# Patient Record
Sex: Male | Born: 1960 | Race: White | Hispanic: No | Marital: Married | Smoking: Former smoker
Health system: Southern US, Community
[De-identification: ages and names within clinical notes are randomized; demographics above are authoritative.]

## PROBLEM LIST (undated history)

## (undated) DIAGNOSIS — F419 Anxiety disorder, unspecified: Secondary | ICD-10-CM

## (undated) DIAGNOSIS — I10 Essential (primary) hypertension: Secondary | ICD-10-CM

## (undated) DIAGNOSIS — E785 Hyperlipidemia, unspecified: Secondary | ICD-10-CM

## (undated) HISTORY — DX: Hyperlipidemia, unspecified: E78.5

---

## 2010-11-09 ENCOUNTER — Ambulatory Visit: Payer: Self-pay | Admitting: Internal Medicine

## 2011-05-17 ENCOUNTER — Ambulatory Visit: Payer: Self-pay

## 2011-05-17 LAB — DOT URINE DIP
Glucose,UR: NEGATIVE mg/dL (ref 0–75)
Protein: NEGATIVE
Specific Gravity: 1.01 (ref 1.003–1.030)

## 2012-04-18 ENCOUNTER — Ambulatory Visit: Payer: Self-pay | Admitting: Family Medicine

## 2012-04-18 LAB — DOT URINE DIP
Blood: NEGATIVE
Protein: NEGATIVE

## 2012-10-13 IMAGING — CR DG CHEST 2V
1 series · 2 of 2 positions shown · non-contrast
Comparison: none

REASON FOR EXAM: cough
COMMENTS:

PROCEDURE:     DXR - DXR CHEST PA (OR AP) AND LATERAL  - November 09, 2010 [DATE]
RESULT:     The lung fields are clear. No pneumonia, pneumothorax or pleural
effusion is seen. Heart size is normal.

[Series 1: w chest pa · 0.14mm/px · 2 of 2 slices shown]
[im 1/2]
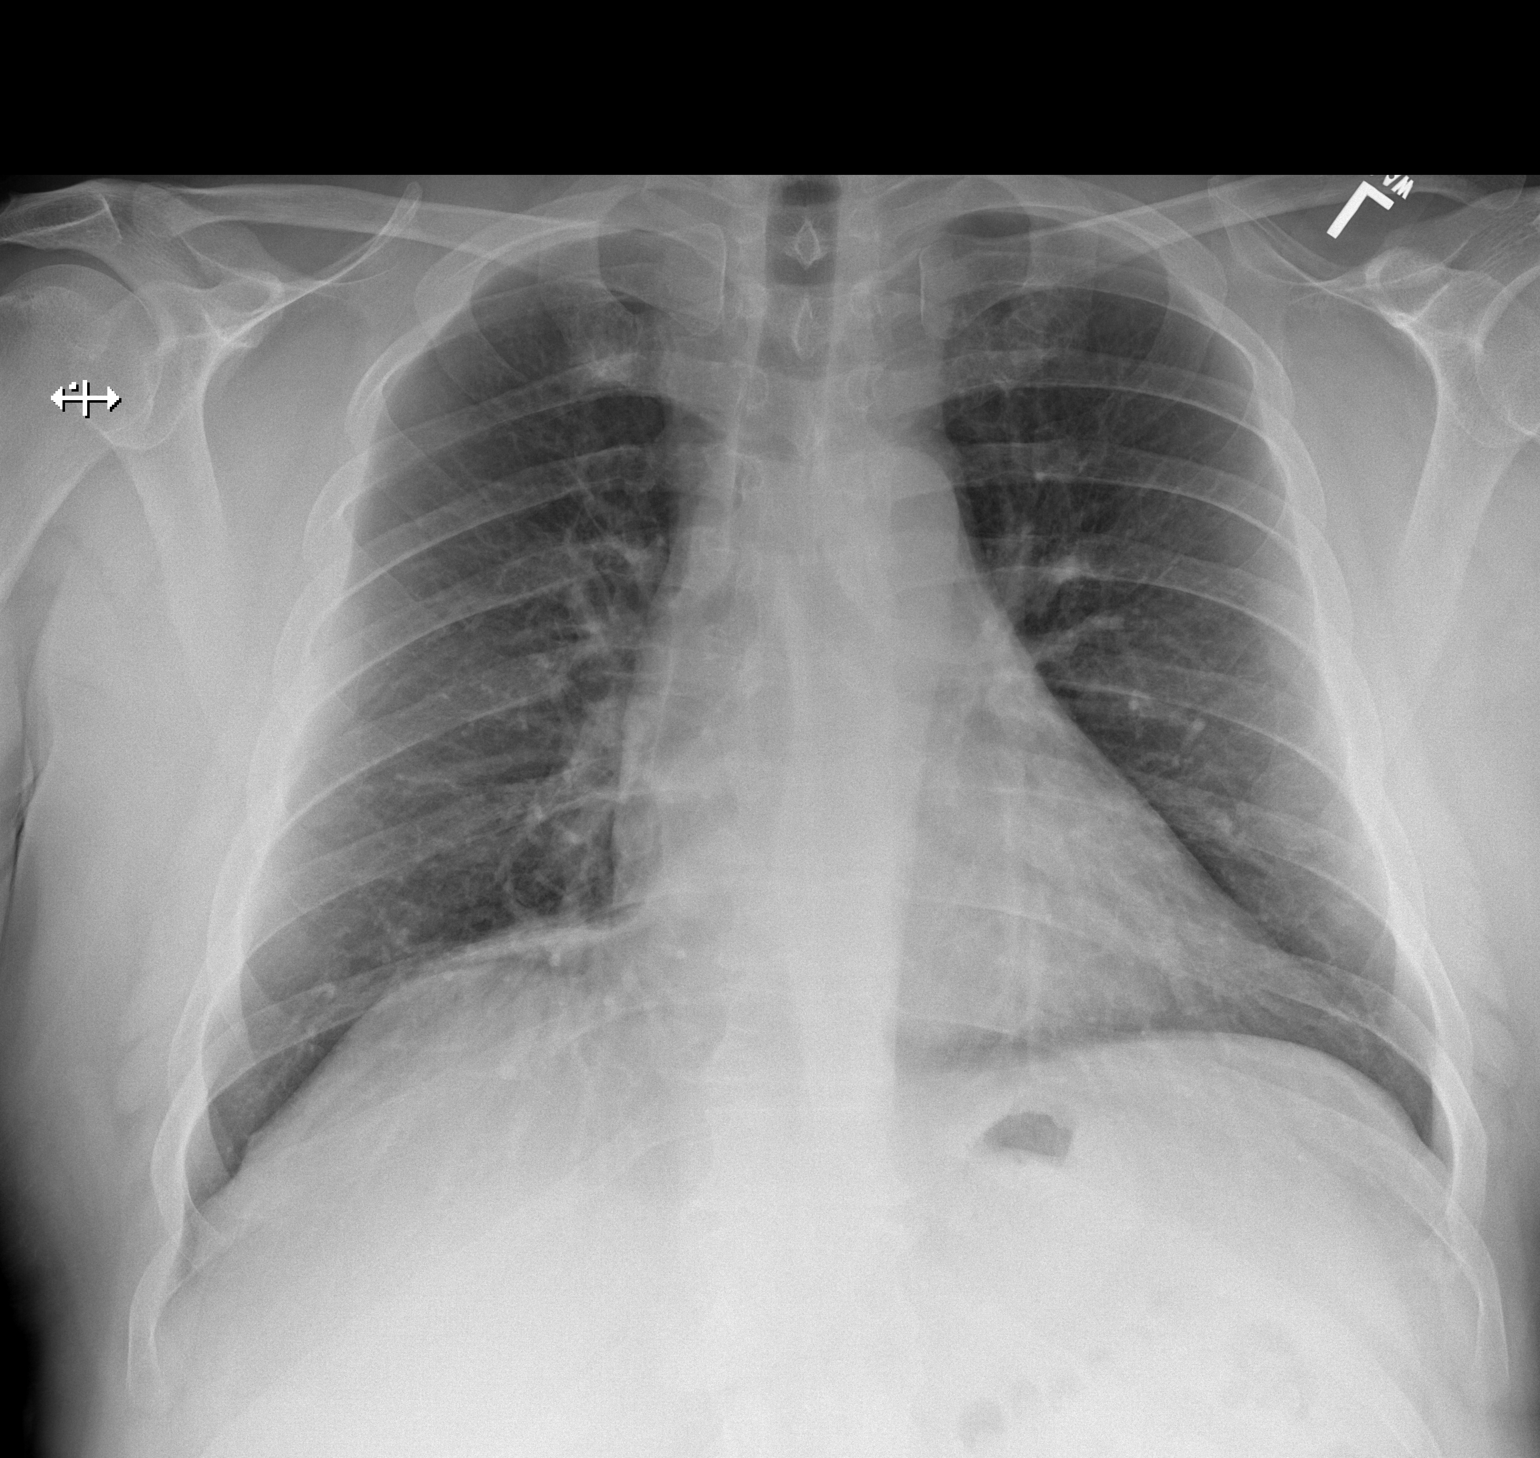
[im 2/2]
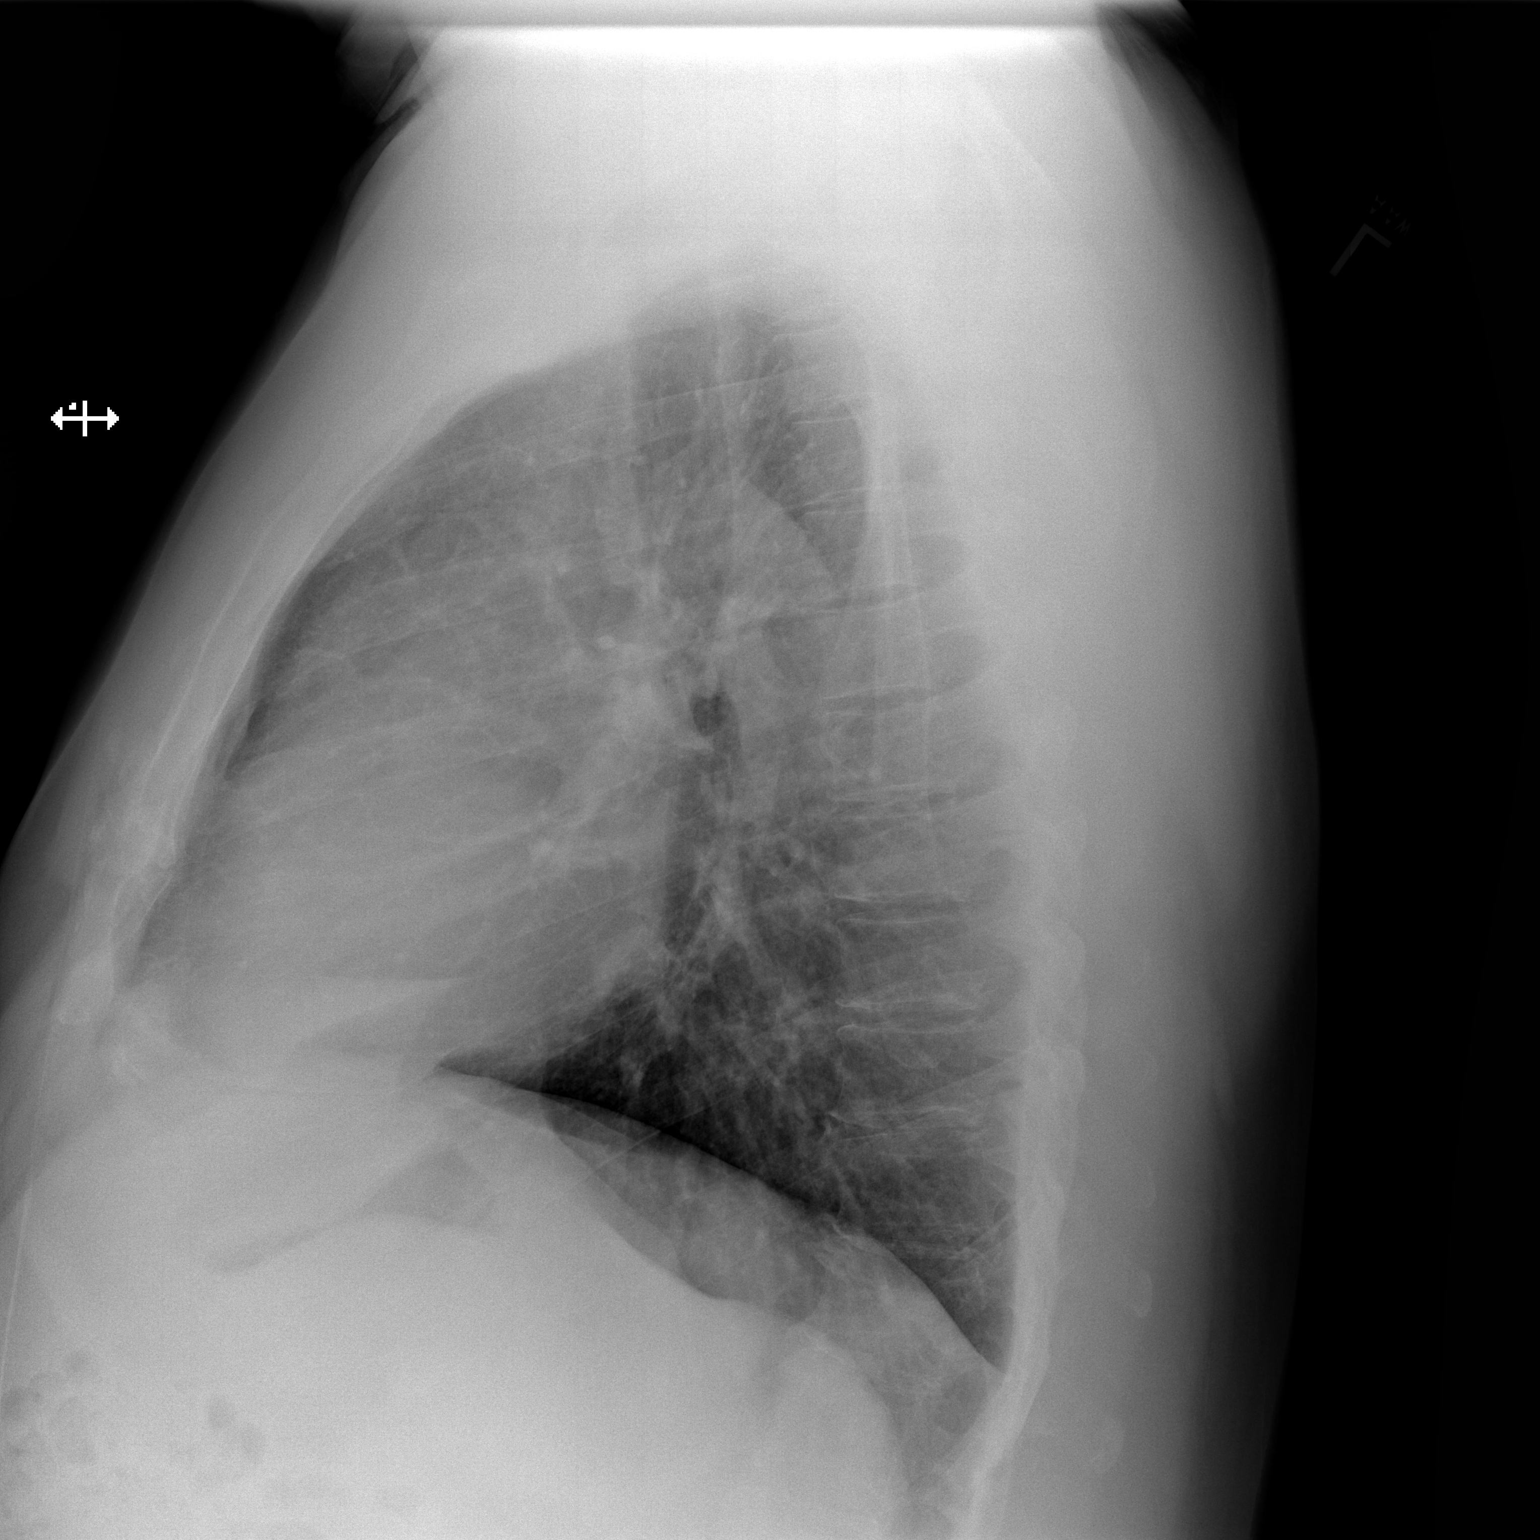

[2 of 2 positions shown; findings below may reference images not displayed]

IMPRESSION: No acute changes are identified.

## 2013-04-10 ENCOUNTER — Ambulatory Visit: Payer: Self-pay | Admitting: Family Medicine

## 2013-04-10 LAB — DOT URINE DIP
BLOOD: NEGATIVE
Glucose,UR: NEGATIVE mg/dL (ref 0–75)
Protein: NEGATIVE
SPECIFIC GRAVITY: 1.01 (ref 1.003–1.030)

## 2013-05-14 ENCOUNTER — Ambulatory Visit: Payer: Self-pay | Admitting: Internal Medicine

## 2013-07-10 ENCOUNTER — Ambulatory Visit: Payer: Self-pay | Admitting: Internal Medicine

## 2014-04-02 ENCOUNTER — Ambulatory Visit: Payer: Self-pay | Admitting: Family Medicine

## 2015-03-23 ENCOUNTER — Ambulatory Visit
Admission: EM | Admit: 2015-03-23 | Discharge: 2015-03-23 | Disposition: A | Discharge status: Home / Self Care | Payer: Self-pay | Attending: Family Medicine | Admitting: Family Medicine

## 2015-03-23 ENCOUNTER — Encounter: Payer: Self-pay | Admitting: *Deleted

## 2015-03-23 DIAGNOSIS — Z021 Encounter for pre-employment examination: Secondary | ICD-10-CM

## 2015-03-23 DIAGNOSIS — Z024 Encounter for examination for driving license: Secondary | ICD-10-CM

## 2015-03-23 HISTORY — DX: Essential (primary) hypertension: I10

## 2015-03-23 HISTORY — DX: Anxiety disorder, unspecified: F41.9

## 2015-03-23 LAB — DEPT OF TRANSP DIPSTICK, URINE (ARMC ONLY)
GLUCOSE, UA: NEGATIVE mg/dL
HGB URINE DIPSTICK: NEGATIVE
Protein, ur: NEGATIVE mg/dL
Specific Gravity, Urine: 1.02 (ref 1.005–1.030)

## 2015-03-23 NOTE — ED Provider Notes (Signed)
CSN: [id number]     Arrival date & time 03/23/15  0857 History   First MD Initiated Contact with Patient 03/23/15 1057    Nurses notes were reviewed. 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 Hypertension   . Anxiety    History reviewed. No pertinent past surgical history. History reviewed. No pertinent family history. Social History  Substance Use Topics  . Smoking status: Former Games developer  . Smokeless tobacco: Never Used  . Alcohol Use: No    Review of Systems  All other systems reviewed and are negative.   Allergies  Review of patient's allergies indicates no known allergies.  Home Medications   Prior to Admission medications   Medication Sig Start Date End Date Taking? Authorizing Provider  amLODipine (NORVASC) 5 MG tablet Take 5 mg by mouth daily.   Yes Historical Provider, MD  lisinopril (PRINIVIL,ZESTRIL) 40 MG tablet Take 40 mg by mouth daily.   Yes Historical Provider, MD  PARoxetine (PAXIL) 20 MG tablet Take 20 mg by mouth daily.   Yes Historical Provider, MD   Meds Ordered and Administered this Visit  Medications - No data to display  BP 140/88 mmHg  Pulse 64  Temp(Src) 97.9 F (36.6 C) (Oral)  Resp 18  Ht  (1.854 m)  Wt 297 lb (134.718 kg)  BMI 39.19 kg/m2  SpO2 100% No data found.   Physical Exam  Constitutional: He is oriented to person, place, and time. He appears well-developed and well-nourished.  Obese white male  HENT:  Head: Normocephalic and atraumatic.  Eyes: Pupils are equal, round, and reactive to light.  Neck: Normal range of motion.  Cardiovascular: Normal rate and regular rhythm.   Pulmonary/Chest: Effort normal and breath sounds normal.  Abdominal: Soft. Hernia confirmed negative in the right inguinal area and confirmed negative in  the left inguinal area.  Protuberant abdomen limited  Genitourinary: Testes normal and penis normal.  Musculoskeletal: Normal range of motion.  Neurological: He is alert and oriented to person, place, and time.  Skin: Skin is warm and dry. No erythema.  Vitals reviewed.   ED Course  Procedures (including critical care time)  Labs Review Labs Reviewed  DEPT OF TRANSP DIPSTICK, URINE([facility] ONLY)    Imaging Review No results found.   Visual Acuity Review  Right Eye Distance:   Left Eye Distance:   Bilateral Distance:    Right Eye Near:   Left Eye Near:    Bilateral Near:         MDM   1. Encounter for commercial driver medical examination (CDME)     Patient be given a year DOT due to hypertension and sleep apnea. He did bring his form and showing 100% compliance over 4 hours the last month with sleep apnea machine.  Note: This dictation was prepared with Dragon dictation along with smaller phrase technology. Any transcriptional errors that result from this process are unintentional.    Hassan Rowan, MD 03/23/15 1130

## 2015-03-23 NOTE — Discharge Instructions (Signed)

## 2015-03-23 NOTE — ED Notes (Signed)
Patient is here for DOT/CDL physical.

## 2016-02-03 ENCOUNTER — Ambulatory Visit: Payer: Self-pay | Admitting: Primary Care

## 2016-02-17 ENCOUNTER — Ambulatory Visit (INDEPENDENT_AMBULATORY_CARE_PROVIDER_SITE_OTHER): Payer: BLUE CROSS/BLUE SHIELD | Admitting: Primary Care

## 2016-02-17 ENCOUNTER — Encounter: Payer: Self-pay | Admitting: Primary Care

## 2016-02-17 VITALS — BP 136/84 | HR 66 | Temp 97.8°F | Ht 73.0 in | Wt 304.8 lb

## 2016-02-17 DIAGNOSIS — E785 Hyperlipidemia, unspecified: Secondary | ICD-10-CM

## 2016-02-17 DIAGNOSIS — Z1159 Encounter for screening for other viral diseases: Secondary | ICD-10-CM | POA: Diagnosis not present

## 2016-02-17 DIAGNOSIS — F411 Generalized anxiety disorder: Secondary | ICD-10-CM

## 2016-02-17 DIAGNOSIS — Z0000 Encounter for general adult medical examination without abnormal findings: Secondary | ICD-10-CM | POA: Diagnosis not present

## 2016-02-17 DIAGNOSIS — I10 Essential (primary) hypertension: Secondary | ICD-10-CM

## 2016-02-17 LAB — LIPID PANEL
CHOLESTEROL: 213 mg/dL — AB (ref 0–200)
HDL: 31.3 mg/dL — ABNORMAL LOW (ref >39.00)
LDL CALC: 151 mg/dL — AB (ref 0–99)
NonHDL: 182.16
TRIGLYCERIDES: 156 mg/dL — AB (ref 0.0–149.0)
Total CHOL/HDL Ratio: 7
VLDL: 31.2 mg/dL (ref 0.0–40.0)

## 2016-02-17 LAB — COMPREHENSIVE METABOLIC PANEL
ALBUMIN: 4.4 g/dL (ref 3.5–5.2)
ALK PHOS: 88 U/L (ref 39–117)
ALT: 34 U/L (ref 0–53)
AST: 22 U/L (ref 0–37)
BILIRUBIN TOTAL: 0.6 mg/dL (ref 0.2–1.2)
BUN: 17 mg/dL (ref 6–23)
CO2: 31 meq/L (ref 19–32)
CREATININE: 0.91 mg/dL (ref 0.40–1.50)
Calcium: 9.8 mg/dL (ref 8.4–10.5)
Chloride: 105 mEq/L (ref 96–112)
GFR: 92.48 mL/min (ref >60.00)
Glucose, Bld: 128 mg/dL — ABNORMAL HIGH (ref 70–99)
Potassium: 5.2 mEq/L — ABNORMAL HIGH (ref 3.5–5.1)
Sodium: 140 mEq/L (ref 135–145)
Total Protein: 7 g/dL (ref 6.0–8.3)

## 2016-02-17 LAB — PSA: PSA: 0.57 ng/mL (ref 0.10–4.00)

## 2016-02-17 LAB — HEMOGLOBIN A1C: HEMOGLOBIN A1C: 6.9 % — AB (ref 4.6–6.5)

## 2016-02-17 MED ORDER — AMLODIPINE BESYLATE 5 MG PO TABS: 5.0000 mg | ORAL_TABLET | Freq: Every day | ORAL | 3 refills | Status: DC

## 2016-02-17 MED ORDER — LISINOPRIL 40 MG PO TABS: 40.0000 mg | ORAL_TABLET | Freq: Every day | ORAL | 3 refills | Status: DC

## 2016-02-17 MED ORDER — PAROXETINE HCL 20 MG PO TABS: 20.0000 mg | ORAL_TABLET | Freq: Every day | ORAL | 3 refills | Status: DC

## 2016-02-17 NOTE — Progress Notes (Signed)
Subjective:    Patient ID: Jared Bradley, male    DOB: 1960/03/26, 56 y.o.   MRN: [id number]  HPI  Mr. Jared Bradley is a 56 year old male who presents today to establish care,  discuss the problems mentioned below, and for complete physical. Will obtain old records.  1) Essential Hypertension: Diagnosed several years ago. Currently managed on Amlodipine 5 mg and lisinopril 40 mg. His BP in the office is 136/84. He denies chest pain, dizziness, blurred vision. He is needing a refill of his medications.  2) GAD: Diagnosed several years ago. Currently managed on Paxil 20 mg. He denies suicidal thoughts and panic attacks. He feels very well managed on this regimen.  3) Hyperlipidemia: Prior history of and managed on Lipitor which caused myalgias. He's not had his cholesterol checked in over 1 year.   Immunizations: -Tetanus: Completed in 2015 -Influenza: Completed in Fall 2017   Diet: He endorses a poor diet. Breakfast: Eggs, toast, juice/milk Lunch: Fast food Dinner: Fast food Snacks: Chips, cookies Desserts: Daily Beverages: Juice/milk, water, some soda, coffee  Exercise: He does not currently exercise Eye exam: Completed several years ago Dental exam: Completes annually Colonoscopy: Declines. PSA: Has not completed recently. Hep C Screen: Never completed   Review of Systems  Constitutional: Negative for unexpected weight change.  HENT: Negative for rhinorrhea.   Respiratory: Negative for cough and shortness of breath.   Cardiovascular: Negative for chest pain.  Gastrointestinal: Negative for constipation and diarrhea.  Genitourinary: Negative for difficulty urinating.  Musculoskeletal: Negative for arthralgias and myalgias.  Skin: Negative for rash.  Allergic/Immunologic: Negative for environmental allergies.  Neurological: Negative for dizziness, numbness and headaches.  Psychiatric/Behavioral: Negative for sleep disturbance. The patient is not nervous/anxious.          Feels well managed on Paxil       Past Medical History:  Diagnosis Date  . Anxiety   . Hyperlipidemia   . Hypertension      Social History   Social History  . Marital status: Married    Spouse name: N/A  . Number of children: N/A  . Years of education: N/A   Occupational History  . Not on file.   Social History Main Topics  . Smoking status: Former Games developermoker  . Smokeless tobacco: Never Used  . Alcohol use No  . Drug use: No  . Sexual activity: Not on file   Other Topics Concern  . Not on file   Social History Narrative   Married.   3 children.   Works as a Naval architectTruck Driver   Enjoys hunting    No past surgical history on file.  Family History  Problem Relation Age of Onset  . Heart disease Father   . Hypertension Father   . Hyperlipidemia Father   . Heart disease Sister   . Diabetes Brother   . Hypertension Maternal Grandmother     Allergies  Allergen Reactions  . Lipitor [Atorvastatin] Other (See Comments)    Myalgias    No current outpatient prescriptions on file prior to visit.   No current facility-administered medications on file prior to visit.     BP 136/84   Pulse 66   Temp 97.8 F (36.6 C) (Oral)   Ht 6\' 1"  (1.854 m)   Wt (!) 304 lb 12.8 oz (138.3 kg)   SpO2 97%   BMI 40.21 kg/m    Objective:   Physical Exam  Constitutional: He is oriented to person, place, and time.  He appears well-nourished.  HENT:  Right Ear: Tympanic membrane and ear canal normal.  Left Ear: Tympanic membrane and ear canal normal.  Nose: Nose normal. Right sinus exhibits no maxillary sinus tenderness and no frontal sinus tenderness. Left sinus exhibits no maxillary sinus tenderness and no frontal sinus tenderness.  Mouth/Throat: Oropharynx is clear and moist.  Eyes: Conjunctivae and EOM are normal. Pupils are equal, round, and reactive to light.  Neck: Neck supple. Carotid bruit is not present. No thyromegaly present.  Cardiovascular: Normal rate, regular  rhythm and normal heart sounds.   Pulmonary/Chest: Effort normal and breath sounds normal. He has no wheezes. He has no rales.  Abdominal: Soft. Bowel sounds are normal. There is no tenderness.  Musculoskeletal: Normal range of motion.  Neurological: He is alert and oriented to person, place, and time. He has normal reflexes. No cranial nerve deficit.  Skin: Skin is warm and dry.  Psychiatric: He has a normal mood and affect.          Assessment & Plan:

## 2016-02-17 NOTE — Assessment & Plan Note (Signed)
Prior history, once managed on Lipitor which he stopped taking due to myalgias. Check lipids today, consider Crestor if above goal.

## 2016-02-17 NOTE — Assessment & Plan Note (Signed)
Well managed on Paxil. Refill provided.

## 2016-02-17 NOTE — Assessment & Plan Note (Signed)
Immunizations UTD. PSA due, pending. Colonoscopy due, he declines. Discussed the importance of a healthy diet and regular exercise in order for weight loss, and to reduce the risk of other medical diseases. Exam unremarkable. Labs pending. Follow up annually for physical.

## 2016-02-17 NOTE — Patient Instructions (Signed)
Complete lab work prior to leaving today.   It's important to improve your diet by reducing consumption of fast food, fried food, processed snack foods, sugary drinks. Increase consumption of fresh vegetables and fruits, whole grains, water.  Ensure you are drinking 64 ounces of water daily.  Start exercising. You should be getting 150 minutes of moderate intensity exercise weekly.  We will be in touch Monday next week regarding your lab results.  It was a pleasure to meet you today! Please don't hesitate to call me with any questions. Welcome to [facility]!

## 2016-02-17 NOTE — Progress Notes (Signed)
Pre visit review using our clinic review tool, if applicable. No additional management support is needed unless otherwise documented below in the visit note. 

## 2016-02-17 NOTE — Assessment & Plan Note (Signed)
Stable on lisinopril and amlodipine.  Continue to monitor, BMP pending.

## 2016-02-18 LAB — HEPATITIS C ANTIBODY: HCV Ab: NEGATIVE

## 2016-02-27 ENCOUNTER — Other Ambulatory Visit: Payer: Self-pay | Admitting: Primary Care

## 2016-02-27 DIAGNOSIS — E119 Type 2 diabetes mellitus without complications: Secondary | ICD-10-CM

## 2016-02-27 DIAGNOSIS — E785 Hyperlipidemia, unspecified: Principal | ICD-10-CM

## 2016-02-27 DIAGNOSIS — E1169 Type 2 diabetes mellitus with other specified complication: Secondary | ICD-10-CM

## 2016-02-27 MED ORDER — ROSUVASTATIN CALCIUM 10 MG PO TABS: 10.0000 mg | ORAL_TABLET | Freq: Every day | ORAL | 1 refills | Status: DC

## 2016-02-27 MED ORDER — METFORMIN HCL 500 MG PO TABS: ORAL_TABLET | ORAL | 0 refills | Status: DC

## 2016-03-02 ENCOUNTER — Ambulatory Visit
Admission: EM | Admit: 2016-03-02 | Discharge: 2016-03-02 | Disposition: A | Discharge status: Home / Self Care | Payer: BLUE CROSS/BLUE SHIELD | Attending: Family Medicine | Admitting: Family Medicine

## 2016-03-02 ENCOUNTER — Encounter: Payer: Self-pay | Admitting: Emergency Medicine

## 2016-03-02 DIAGNOSIS — Z024 Encounter for examination for driving license: Secondary | ICD-10-CM

## 2016-03-02 LAB — DEPT OF TRANSP DIPSTICK, URINE (ARMC ONLY)
Glucose, UA: NEGATIVE mg/dL
Hgb urine dipstick: NEGATIVE
Protein, ur: NEGATIVE mg/dL

## 2016-03-02 NOTE — ED Provider Notes (Signed)
[facility]    CSN: [id number] Arrival date & time: 03/02/16  0807     History   Chief Complaint Chief Complaint  Patient presents with  . DOT Physical    HPI Jared Bradley is a 56 y.o. male.   Patient is here for DOT Exam.  Patient has a history hypertension and sleep apnea he did bring his paperwork indicating that he is compliant with the sleep apnea and blood pressures under control   The history is provided by the patient. No language interpreter was used.    Past Medical History:  Diagnosis Date  . Anxiety   . Hyperlipidemia   . Hypertension     Patient Active Problem List   Diagnosis Date Noted  . GAD (generalized anxiety disorder) 02/17/2016  . Essential hypertension 02/17/2016  . Preventative health care 02/17/2016  . Hyperlipidemia 02/17/2016    History reviewed. No pertinent surgical history.     Home Medications    Prior to Admission medications   Medication Sig Start Date End Date Taking? Authorizing Provider  amLODipine (NORVASC) 5 MG tablet Take 1 tablet (5 mg total) by mouth daily. 02/17/16   Doreene NestKatherine K Clark, NP  ASPIRIN 81 PO Take 81 mg by mouth daily.    Historical Provider, MD  lisinopril (PRINIVIL,ZESTRIL) 40 MG tablet Take 1 tablet (40 mg total) by mouth daily. 02/17/16   Doreene NestKatherine K Clark, NP  PARoxetine (PAXIL) 20 MG tablet Take 1 tablet (20 mg total) by mouth daily. 02/17/16   Doreene NestKatherine K Clark, NP    Family History Family History  Problem Relation Age of Onset  . Heart disease Father   . Hypertension Father   . Hyperlipidemia Father   . Heart disease Sister   . Diabetes Brother   . Hypertension Maternal Grandmother     Social History Social History  Substance Use Topics  . Smoking status: Former Games developermoker  . Smokeless tobacco: Never Used  . Alcohol use No     Allergies   Lipitor [atorvastatin]   Review of Systems Review of Systems  Constitutional: Negative.   All other systems reviewed and are  negative.    Physical Exam Triage Vital Signs ED Triage Vitals  Enc Vitals Group     BP 03/02/16 0825 138/88     Pulse Rate 03/02/16 0825 63     Resp 03/02/16 0825 16     Temp 03/02/16 0825 97.7 F (36.5 C)     Temp Source 03/02/16 0825 Oral     SpO2 03/02/16 0825 99 %     Weight 03/02/16 0823 (!) 303 lb (137.4 kg)     Height 03/02/16 0823 6\' 1"  (1.854 m)     Head Circumference --      Peak Flow --      Pain Score 03/02/16 0825 0     Pain Loc --      Pain Edu? --      Excl. in GC? --    No data found.   Updated Vital Signs BP 138/88 (BP Location: Right Arm)   Pulse 63   Temp 97.7 F (36.5 C) (Oral)   Resp 16   Ht 6\' 1"  (1.854 m)   Wt (!) 303 lb (137.4 kg)   SpO2 99%   BMI 39.98 kg/m   Visual Acuity Right Eye Distance: 20/25 uncorrected Left Eye Distance: 20/20 uncorrected Bilateral Distance: 20/20 uncorrected  Right Eye Near:   Left Eye Near:    Bilateral Near:  Physical Exam  Constitutional: He appears well-developed and well-nourished.  HENT:  Head: Normocephalic and atraumatic.  Right Ear: External ear normal.  Left Ear: External ear normal.  Nose: Nose normal.  Mouth/Throat: Oropharynx is clear and moist.  Eyes: Pupils are equal, round, and reactive to light.  Neck: Normal range of motion. Neck supple. No thyromegaly present.  Cardiovascular: Normal rate, regular rhythm and normal heart sounds.   Pulmonary/Chest: Effort normal and breath sounds normal.  Abdominal: Soft.  Genitourinary: Testes normal and penis normal. Right testis shows no mass and no tenderness. Left testis shows no mass. Circumcised.  Musculoskeletal: Normal range of motion.  Lymphadenopathy:    He has no cervical adenopathy.  Neurological: He is alert.  Skin: Skin is warm.  Psychiatric: He has a normal mood and affect.  Vitals reviewed.    UC Treatments / Results  Labs (all labs ordered are listed, but only abnormal results are displayed) Labs Reviewed  DEPT OF  TRANSP DIPSTICK, URINE([facility] ONLY) - Abnormal; Notable for the following:       Result Value   Specific Gravity, Urine >1.030 (*)    All other components within normal limits    EKG  EKG Interpretation None       Radiology No results found.  Procedures Procedures (including critical care time)  Medications Ordered in UC Medications - No data to display   Initial Impression / Assessment and Plan / UC Course  I have reviewed the triage vital signs and the nursing notes.  Pertinent labs & imaging results that were available during my care of the patient were reviewed by me and considered in my medical decision making (see chart for details).   she'll be given a year DOT card.   Final Clinical Impressions(s) / UC Diagnoses   Final diagnoses:  Encounter for Department of Transportation (DOT) examination for driving license renewal    New Prescriptions Discharge Medication List as of 03/02/2016  9:16 AM       Hassan Rowan, MD 03/02/16 [phone]

## 2016-03-02 NOTE — ED Triage Notes (Signed)
Patient here for DOT Physical.  

## 2016-03-09 ENCOUNTER — Telehealth: Payer: Self-pay

## 2016-03-09 NOTE — Telephone Encounter (Signed)
Pt left v/m requesting cb about medicines. Left v/m requesting pt to cb.

## 2016-03-21 NOTE — Telephone Encounter (Signed)
Left v/m requesting pt to cb. 

## 2016-05-18 ENCOUNTER — Encounter: Payer: Self-pay | Admitting: Primary Care

## 2016-05-18 ENCOUNTER — Ambulatory Visit (INDEPENDENT_AMBULATORY_CARE_PROVIDER_SITE_OTHER): Payer: BLUE CROSS/BLUE SHIELD | Admitting: Primary Care

## 2016-05-18 VITALS — BP 122/74 | HR 69 | Temp 97.5°F | Ht 73.0 in | Wt 289.1 lb

## 2016-05-18 DIAGNOSIS — E785 Hyperlipidemia, unspecified: Secondary | ICD-10-CM | POA: Diagnosis not present

## 2016-05-18 DIAGNOSIS — I10 Essential (primary) hypertension: Secondary | ICD-10-CM | POA: Diagnosis not present

## 2016-05-18 DIAGNOSIS — E119 Type 2 diabetes mellitus without complications: Secondary | ICD-10-CM | POA: Diagnosis not present

## 2016-05-18 DIAGNOSIS — E1165 Type 2 diabetes mellitus with hyperglycemia: Secondary | ICD-10-CM | POA: Insufficient documentation

## 2016-05-18 LAB — LIPID PANEL
CHOL/HDL RATIO: 7
Cholesterol: 216 mg/dL — ABNORMAL HIGH (ref 0–200)
HDL: 31.1 mg/dL — ABNORMAL LOW (ref >39.00)
LDL CALC: 159 mg/dL — AB (ref 0–99)
NonHDL: 184.8
TRIGLYCERIDES: 127 mg/dL (ref 0.0–149.0)
VLDL: 25.4 mg/dL (ref 0.0–40.0)

## 2016-05-18 LAB — HEMOGLOBIN A1C: Hgb A1c MFr Bld: 6.3 % (ref 4.6–6.5)

## 2016-05-18 NOTE — Assessment & Plan Note (Signed)
Diagnosis as of late January 2018. Never started Metformin as recommended as he wanted to work on diet. Weight loss of 15 pounds since last visit. Check A1C and continue to monitor.

## 2016-05-18 NOTE — Assessment & Plan Note (Signed)
Never started Crestor as he wanted to work on diet.  Weight loss of 15 pounds, commended him on this success. Check lipids today, will await results.

## 2016-05-18 NOTE — Progress Notes (Signed)
Pre visit review using our clinic review tool, if applicable. No additional management support is needed unless otherwise documented below in the visit note. 

## 2016-05-18 NOTE — Progress Notes (Signed)
Subjective:    Patient ID: Jared Bradley, male    DOB: [birth year], 55 y.o.   MRN: [id number]  HPI  Jared Bradley is a 56 year old male who presents today for follow up.  1) Hyperlipidemia: Lipids above goal last visit, previously managed on Lipitor which caused myalgias. Switched to Crestor last visit for which he never started as he wanted to work on diet. He is due for repeat lipids today. He denies chest pain, shortness of breath.   2) Type 2 Diabetes: A1C of 6.9 in late January 2018. He was initiated on Metformin 500 mg BID shortly after that visit. He never started the Metformin as he wanted to work on his diet. He has lost 15 pounds since his last visit. He's cut back on carbs including breads and sugars.  Wt Readings from Last 3 Encounters:  05/18/16 289 lb 1.9 oz (131.1 kg)  03/02/16 (!) 303 lb (137.4 kg)  02/17/16 (!) 304 lb 12.8 oz (138.3 kg)    Diet currently consists of:  Breakfast: Fast food, eggs and bacon Lunch: Grilled chicken, chili Dinner: Grilled meat, beans, some vegetables Snacks: Occasionally sunflower seeds Desserts: Little  Beverages: Water, occasional diet Dr. Reino Kent.  Exercise: He is not exercising.     Review of Systems  Eyes: Negative for visual disturbance.  Respiratory: Negative for shortness of breath.   Cardiovascular: Negative for chest pain.  Neurological: Negative for numbness and headaches.       Past Medical History:  Diagnosis Date  . Anxiety   . Hyperlipidemia   . Hypertension      Social History   Social History  . Marital status: Married    Spouse name: N/A  . Number of children: N/A  . Years of education: N/A   Occupational History  . Not on file.   Social History Main Topics  . Smoking status: Former Games developer  . Smokeless tobacco: Never Used  . Alcohol use No  . Drug use: No  . Sexual activity: Not on file   Other Topics Concern  . Not on file   Social History Narrative   Married.   3 children.   Works as a Naval architect   Enjoys hunting    No past surgical history on file.  Family History  Problem Relation Age of Onset  . Heart disease Father   . Hypertension Father   . Hyperlipidemia Father   . Heart disease Sister   . Diabetes Brother   . Hypertension Maternal Grandmother     Allergies  Allergen Reactions  . Lipitor [Atorvastatin] Other (See Comments)    Myalgias    Current Outpatient Prescriptions on File Prior to Visit  Medication Sig Dispense Refill  . amLODipine (NORVASC) 5 MG tablet Take 1 tablet (5 mg total) by mouth daily. 90 tablet 3  . ASPIRIN 81 PO Take 81 mg by mouth daily.    Marland Kitchen lisinopril (PRINIVIL,ZESTRIL) 40 MG tablet Take 1 tablet (40 mg total) by mouth daily. 90 tablet 3  . PARoxetine (PAXIL) 20 MG tablet Take 1 tablet (20 mg total) by mouth daily. 90 tablet 3   No current facility-administered medications on file prior to visit.     BP 122/74   Pulse 69   Temp 97.5 F (36.4 C) (Oral)   Ht  (1.854 m)   Wt 289 lb 1.9 oz (131.1 kg)   SpO2 97%   BMI 38.14 kg/m    Objective:   Physical Exam  Constitutional: He appears well-nourished.  Neck: Neck supple.  Cardiovascular: Normal rate and regular rhythm.   Pulmonary/Chest: Effort normal and breath sounds normal.  Skin: Skin is warm and dry.          Assessment & Plan:

## 2016-05-18 NOTE — Patient Instructions (Signed)
Complete lab work prior to leaving today. I will notify you of your results once received.   Congratulations on your weight loss, continue to work on improvements in your diet.  Start exercising. You should be getting 150 minutes of moderate intensity exercise weekly.  Ensure you are consuming 64 ounces of water daily.  Continue your blood pressure medications.  It was a pleasure to see you today!

## 2016-05-18 NOTE — Assessment & Plan Note (Signed)
Improved, likely due to weight loss. Continue Lisinopril and Amlodipine for now, may need to reduce medication dose, will have him monitor.

## 2016-06-01 NOTE — Telephone Encounter (Signed)
Pt seen 05/18/16.

## 2016-06-30 ENCOUNTER — Other Ambulatory Visit: Payer: Self-pay | Admitting: Primary Care

## 2016-06-30 DIAGNOSIS — E119 Type 2 diabetes mellitus without complications: Secondary | ICD-10-CM

## 2016-08-17 ENCOUNTER — Other Ambulatory Visit: Payer: BLUE CROSS/BLUE SHIELD

## 2016-08-24 ENCOUNTER — Other Ambulatory Visit (INDEPENDENT_AMBULATORY_CARE_PROVIDER_SITE_OTHER): Payer: BLUE CROSS/BLUE SHIELD

## 2016-08-24 DIAGNOSIS — E785 Hyperlipidemia, unspecified: Secondary | ICD-10-CM

## 2016-08-24 LAB — LIPID PANEL
CHOLESTEROL: 191 mg/dL (ref 0–200)
HDL: 33.5 mg/dL — AB (ref >39.00)
LDL CALC: 140 mg/dL — AB (ref 0–99)
NonHDL: 157.14
TRIGLYCERIDES: 85 mg/dL (ref 0.0–149.0)
Total CHOL/HDL Ratio: 6
VLDL: 17 mg/dL (ref 0.0–40.0)

## 2016-09-26 ENCOUNTER — Telehealth: Payer: Self-pay | Admitting: Primary Care

## 2016-09-26 NOTE — Telephone Encounter (Signed)
Caller Name:self  Relationship to Patient:self  Best number: Pharmacy:  Reason for call:  Pt has cpe labs this Friday, needs orders. cpe is next Wednesday

## 2016-09-27 ENCOUNTER — Other Ambulatory Visit: Payer: Self-pay | Admitting: Primary Care

## 2016-09-27 DIAGNOSIS — E119 Type 2 diabetes mellitus without complications: Secondary | ICD-10-CM

## 2016-09-27 DIAGNOSIS — E785 Hyperlipidemia, unspecified: Secondary | ICD-10-CM

## 2016-09-28 ENCOUNTER — Other Ambulatory Visit (INDEPENDENT_AMBULATORY_CARE_PROVIDER_SITE_OTHER): Payer: BLUE CROSS/BLUE SHIELD

## 2016-09-28 DIAGNOSIS — E785 Hyperlipidemia, unspecified: Secondary | ICD-10-CM | POA: Diagnosis not present

## 2016-09-28 DIAGNOSIS — E119 Type 2 diabetes mellitus without complications: Secondary | ICD-10-CM

## 2016-09-28 LAB — LIPID PANEL
Cholesterol: 218 mg/dL — ABNORMAL HIGH (ref 0–200)
HDL: 30.7 mg/dL — AB (ref >39.00)
LDL Cholesterol: 167 mg/dL — ABNORMAL HIGH (ref 0–99)
NONHDL: 187.67
TRIGLYCERIDES: 101 mg/dL (ref 0.0–149.0)
Total CHOL/HDL Ratio: 7
VLDL: 20.2 mg/dL (ref 0.0–40.0)

## 2016-09-28 LAB — HEMOGLOBIN A1C: Hgb A1c MFr Bld: 6.2 % (ref 4.6–6.5)

## 2016-09-28 LAB — GLUCOSE, RANDOM: GLUCOSE: 107 mg/dL — AB (ref 70–99)

## 2016-10-03 ENCOUNTER — Ambulatory Visit: Payer: BLUE CROSS/BLUE SHIELD | Admitting: Primary Care

## 2016-10-25 ENCOUNTER — Telehealth: Payer: Self-pay | Admitting: Primary Care

## 2016-10-25 NOTE — Telephone Encounter (Signed)
Wife dropped off for from work that needs to be filled out.  Placing in rx tower when done.  Please call when ready [phone]

## 2016-10-25 NOTE — Telephone Encounter (Signed)
Gave form to Jared Bradley for review.

## 2016-10-25 NOTE — Telephone Encounter (Signed)
Form completed except for waist measurement. Placed in Chan's inbox.

## 2016-10-26 NOTE — Telephone Encounter (Signed)
Message left for patient to return my call.  

## 2016-10-30 NOTE — Telephone Encounter (Signed)
Spoken and notified patient of Kate's comments. Patient verbalized understanding.  Patient stated that he will come by for the measurement and pick up form

## 2017-01-11 ENCOUNTER — Telehealth: Payer: Self-pay | Admitting: Primary Care

## 2017-01-11 NOTE — Telephone Encounter (Signed)
Lelon MastSamantha from [facility] called to get the last HgBA1C for this patient, results given, she requested the results to be faxed to her at [phone].

## 2017-01-11 NOTE — Telephone Encounter (Signed)
Results faxed as requested

## 2017-01-14 ENCOUNTER — Encounter: Payer: Self-pay | Admitting: Primary Care

## 2017-01-14 ENCOUNTER — Ambulatory Visit (INDEPENDENT_AMBULATORY_CARE_PROVIDER_SITE_OTHER): Payer: BLUE CROSS/BLUE SHIELD | Admitting: Primary Care

## 2017-01-14 VITALS — BP 122/76 | HR 78 | Temp 98.2°F | Ht 73.0 in | Wt 306.1 lb

## 2017-01-14 DIAGNOSIS — J069 Acute upper respiratory infection, unspecified: Secondary | ICD-10-CM | POA: Diagnosis not present

## 2017-01-14 DIAGNOSIS — R0602 Shortness of breath: Secondary | ICD-10-CM | POA: Diagnosis not present

## 2017-01-14 MED ORDER — PREDNISONE 20 MG PO TABS: ORAL_TABLET | ORAL | 0 refills | Status: DC

## 2017-01-14 NOTE — Patient Instructions (Signed)
Start prednisone tablets. Take 2 tablets once daily for 5 days.  Ensure you are consuming 64 ounces of water daily.  You can try Robitussin or Delsym as needed for cough.  Please call me Monday next week if no improvement in cough and/or you develop fevers, increased shortness of breath, coughing up green mucous.  It was a pleasure to see you today!

## 2017-01-14 NOTE — Progress Notes (Signed)
Subjective:    Patient ID: Jared Bradley, male    DOB: 07-Apr-1960, 56 y.o.   MRN: [id number]  HPI  Jared Bradley is a 56 year old male with a history of type 2 diabetes, hypertension, bronchitis who presents today with a chief complaint of cough. He also reports sore throat. His symptoms began 2 days ago. He will experience shortness of breath with exertion, mild wheezing.  He's been visiting his grandson in the hospital, think he may have picked up something. He's taken Sudafed without improvement.   Review of Systems  Constitutional: Negative for fatigue and fever.  HENT: Positive for congestion and sore throat. Negative for sinus pressure.   Respiratory: Positive for cough and shortness of breath.   Cardiovascular: Negative for chest pain.       Past Medical History:  Diagnosis Date  . Anxiety   . Hyperlipidemia   . Hypertension      Social History   Socioeconomic History  . Marital status: Married    Spouse name: Not on file  . Number of children: Not on file  . Years of education: Not on file  . Highest education level: Not on file  Social Needs  . Financial resource strain: Not on file  . Food insecurity - worry: Not on file  . Food insecurity - inability: Not on file  . Transportation needs - medical: Not on file  . Transportation needs - non-medical: Not on file  Occupational History  . Not on file  Tobacco Use  . Smoking status: Former Games developermoker  . Smokeless tobacco: Never Used  Substance and Sexual Activity  . Alcohol use: No  . Drug use: No  . Sexual activity: Not on file  Other Topics Concern  . Not on file  Social History Narrative   Married.   3 children.   Works as a Naval architectTruck Driver   Enjoys hunting    No past surgical history on file.  Family History  Problem Relation Age of Onset  . Heart disease Father   . Hypertension Father   . Hyperlipidemia Father   . Heart disease Sister   . Diabetes Brother   . Hypertension Maternal  Grandmother     Allergies  Allergen Reactions  . Lipitor [Atorvastatin] Other (See Comments)    Myalgias    Current Outpatient Medications on File Prior to Visit  Medication Sig Dispense Refill  . amLODipine (NORVASC) 5 MG tablet Take 1 tablet (5 mg total) by mouth daily. 90 tablet 3  . ASPIRIN 81 PO Take 81 mg by mouth daily.    Marland Kitchen. lisinopril (PRINIVIL,ZESTRIL) 40 MG tablet Take 1 tablet (40 mg total) by mouth daily. 90 tablet 3  . metFORMIN (GLUCOPHAGE) 500 MG tablet Take 1 tablet (500 mg total) by mouth 2 (two) times daily with a meal. 180 tablet 1  . PARoxetine (PAXIL) 20 MG tablet Take 1 tablet (20 mg total) by mouth daily. 90 tablet 3  . rosuvastatin (CRESTOR) 10 MG tablet Take 10 mg by mouth daily.      No current facility-administered medications on file prior to visit.     BP 122/76   Pulse 78   Temp 98.2 F (36.8 C) (Oral)   Ht 6\' 1"  (1.854 m)   Wt (!) 306 lb 1.9 oz (138.9 kg)   SpO2 98%   BMI 40.39 kg/m    Objective:   Physical Exam  Constitutional: He appears well-nourished.  HENT:  Right Ear: Tympanic membrane  and ear canal normal.  Left Ear: Tympanic membrane and ear canal normal.  Nose: Mucosal edema present. Right sinus exhibits no maxillary sinus tenderness and no frontal sinus tenderness. Left sinus exhibits no maxillary sinus tenderness and no frontal sinus tenderness.  Mouth/Throat: Oropharynx is clear and moist.  Eyes: Conjunctivae are normal.  Neck: Neck supple.  Cardiovascular: Normal rate and regular rhythm.  Pulmonary/Chest: Effort normal and breath sounds normal. He has no wheezes. He has no rales.  Dry cough during exam.  Skin: Skin is warm and dry.          Assessment & Plan:  URI:  Cough, congestion, shortness of breath x 2 days. Exam today with dry cough present, tightness to airways. Vitals stable. Do suspect viral involvement at this point. Will treat conservatively. Rx for prednisone burst sent to pharmacy. Discussed use of  Delsym or Robitussin. Fluids, rest, follow up PRN. Return precautions provided.  Morrie Sheldonlark,Katherine Kendal, NP

## 2017-01-17 ENCOUNTER — Other Ambulatory Visit: Payer: Self-pay | Admitting: *Deleted

## 2017-01-17 ENCOUNTER — Telehealth: Payer: Self-pay | Admitting: Primary Care

## 2017-01-17 DIAGNOSIS — F411 Generalized anxiety disorder: Secondary | ICD-10-CM

## 2017-01-17 MED ORDER — PAROXETINE HCL 20 MG PO TABS: 20.0000 mg | ORAL_TABLET | Freq: Every day | ORAL | 3 refills | Status: DC

## 2017-01-17 NOTE — Telephone Encounter (Signed)
Copied from CRM [phone]. Topic: Quick Communication - See Telephone Encounter >> Jan 17, 2017 11:17 AM Arlyss Gandyichardson, Jadwiga Faidley N, NT wrote: CRM for notification. See Telephone encounter for: Pt would like a refill of PARoxetine (PAXIL). [facility] [address]  01/17/17.

## 2017-01-18 ENCOUNTER — Encounter: Payer: Self-pay | Admitting: Primary Care

## 2017-01-18 DIAGNOSIS — J209 Acute bronchitis, unspecified: Secondary | ICD-10-CM

## 2017-01-18 MED ORDER — AZITHROMYCIN 250 MG PO TABS: ORAL_TABLET | ORAL | 0 refills | Status: DC

## 2017-01-18 NOTE — Telephone Encounter (Signed)
Pt called back and wants to know if something can be called into his pharmacy since he is not better and is out of the meds today. He can not be seen until 12/31 to see Dr Para Marchuncan since Chest[facility] SporeClark is off. Please let patient know either way.

## 2017-01-21 ENCOUNTER — Ambulatory Visit: Payer: Self-pay | Admitting: Family Medicine

## 2017-02-21 ENCOUNTER — Other Ambulatory Visit: Payer: Self-pay | Admitting: Primary Care

## 2017-02-21 DIAGNOSIS — E785 Hyperlipidemia, unspecified: Secondary | ICD-10-CM

## 2017-02-21 DIAGNOSIS — I10 Essential (primary) hypertension: Secondary | ICD-10-CM

## 2017-02-21 DIAGNOSIS — E119 Type 2 diabetes mellitus without complications: Secondary | ICD-10-CM

## 2017-02-22 ENCOUNTER — Encounter: Payer: Self-pay | Admitting: Primary Care

## 2017-03-01 ENCOUNTER — Other Ambulatory Visit: Payer: BLUE CROSS/BLUE SHIELD

## 2017-03-07 ENCOUNTER — Other Ambulatory Visit: Payer: BLUE CROSS/BLUE SHIELD

## 2017-03-08 ENCOUNTER — Encounter: Payer: Self-pay | Admitting: Primary Care

## 2017-04-06 ENCOUNTER — Other Ambulatory Visit: Payer: Self-pay | Admitting: Primary Care

## 2017-04-06 DIAGNOSIS — I10 Essential (primary) hypertension: Secondary | ICD-10-CM

## 2017-05-13 ENCOUNTER — Other Ambulatory Visit: Payer: Self-pay | Admitting: Primary Care

## 2017-05-13 DIAGNOSIS — I10 Essential (primary) hypertension: Secondary | ICD-10-CM

## 2017-06-04 ENCOUNTER — Other Ambulatory Visit: Payer: Self-pay | Admitting: Primary Care

## 2017-06-04 DIAGNOSIS — E119 Type 2 diabetes mellitus without complications: Secondary | ICD-10-CM

## 2017-06-04 DIAGNOSIS — I10 Essential (primary) hypertension: Secondary | ICD-10-CM

## 2017-06-04 DIAGNOSIS — E782 Mixed hyperlipidemia: Secondary | ICD-10-CM

## 2017-06-10 ENCOUNTER — Other Ambulatory Visit: Payer: BLUE CROSS/BLUE SHIELD

## 2017-06-14 ENCOUNTER — Encounter: Payer: BLUE CROSS/BLUE SHIELD | Admitting: Primary Care

## 2017-06-18 ENCOUNTER — Other Ambulatory Visit: Payer: Self-pay | Admitting: Primary Care

## 2017-06-18 DIAGNOSIS — I10 Essential (primary) hypertension: Secondary | ICD-10-CM

## 2017-06-18 NOTE — Telephone Encounter (Addendum)
Last seen 04/2016 for routine care.  OV in 12/2016 was for acute respiratory issue.  Patient has been told multiple times that he needs to be seen before more refills.  There is a pattern since February where patient has scheduled appointments twice (February, May- appears we may have cancelled this one), meds filled each time,  and he has cancelled.  Current Appointment scheduled for 08/09/17.    Will pend for Allayne Gitelman, NP approval as patient has had hx of cancellations and no shows.

## 2017-06-19 NOTE — Telephone Encounter (Signed)
What's the protocol for this? Do we need to send a letter? Can we call patient to explain the situation?

## 2017-06-21 NOTE — Telephone Encounter (Signed)
Spoke with patient and explained necessity to keep July appointment.  He is aware that I will give him enough refills to last to appointment but no further until he is seen.  He verbalizes understanding and states that he would have been seen in May had we not cancelled his appointment.  [facility] [address] on each medication: Amlodipine, Lisinopril.

## 2017-07-29 ENCOUNTER — Telehealth: Payer: Self-pay | Admitting: Primary Care

## 2017-07-29 DIAGNOSIS — E785 Hyperlipidemia, unspecified: Secondary | ICD-10-CM

## 2017-07-29 DIAGNOSIS — F411 Generalized anxiety disorder: Secondary | ICD-10-CM

## 2017-07-29 DIAGNOSIS — E119 Type 2 diabetes mellitus without complications: Secondary | ICD-10-CM

## 2017-07-29 MED ORDER — PAROXETINE HCL 20 MG PO TABS: 20.0000 mg | ORAL_TABLET | Freq: Every day | ORAL | 0 refills | Status: DC

## 2017-07-29 MED ORDER — METFORMIN HCL 500 MG PO TABS: 500.0000 mg | ORAL_TABLET | Freq: Two times a day (BID) | ORAL | 0 refills | Status: DC

## 2017-07-29 NOTE — Telephone Encounter (Signed)
Copied from CRM [phone]. Topic: Quick Communication - Rx Refill/Question >> Jul 29, 2017  9:19 AM Arlyss Gandyichardson, Jared Bradley, Jared Bradley wrote: Medication: metFORMIN (GLUCOPHAGE) 500 MG tablet, PARoxetine (PAXIL) 20 MG tablet, rosuvastatin (CRESTOR) 10 MG tablet. Pt is requesting just a 30 day supply. After he sees the dr will be changing pharmacies.   Has the patient contacted their pharmacy? Yes.   (Agent: If no, request that the patient contact the pharmacy for the refill.) (Agent: If yes, when and what did the pharmacy advise?)  Preferred Pharmacy (with phone number or street name): [id number] - [address] [phone] (Phone) [phone] (Fax)      Agent: Please be advised that RX refills may take up to 3 business days. We ask that you follow-up with your pharmacy.

## 2017-07-29 NOTE — Telephone Encounter (Signed)
Rosuvastatin refill, pt requesting 30 day supply.Pt states after appt on 7/19 he will be changing pharmacies. Rosuvastatin filled by historical provider previously.  30 day supply of Metformin and Paxil sent to pharmacy as requested LOV: 05/18/16 Next OV:08/09/17 K. Clark,NP  [facility] [address] [place]

## 2017-07-30 MED ORDER — ROSUVASTATIN CALCIUM 10 MG PO TABS: ORAL_TABLET | ORAL | 0 refills | Status: DC

## 2017-07-30 NOTE — Telephone Encounter (Signed)
Noted, refill sent to pharmacy. Patient scheduled for CPE next week..Marland Kitchen

## 2017-08-05 ENCOUNTER — Other Ambulatory Visit (INDEPENDENT_AMBULATORY_CARE_PROVIDER_SITE_OTHER): Payer: BLUE CROSS/BLUE SHIELD

## 2017-08-05 DIAGNOSIS — E119 Type 2 diabetes mellitus without complications: Secondary | ICD-10-CM

## 2017-08-05 DIAGNOSIS — I10 Essential (primary) hypertension: Secondary | ICD-10-CM | POA: Diagnosis not present

## 2017-08-05 DIAGNOSIS — E782 Mixed hyperlipidemia: Secondary | ICD-10-CM

## 2017-08-05 LAB — COMPREHENSIVE METABOLIC PANEL
ALBUMIN: 4.2 g/dL (ref 3.5–5.2)
ALK PHOS: 76 U/L (ref 39–117)
ALT: 41 U/L (ref 0–53)
AST: 33 U/L (ref 0–37)
BUN: 21 mg/dL (ref 6–23)
CHLORIDE: 104 meq/L (ref 96–112)
CO2: 28 mEq/L (ref 19–32)
Calcium: 9.3 mg/dL (ref 8.4–10.5)
Creatinine, Ser: 1.06 mg/dL (ref 0.40–1.50)
GFR: 77.12 mL/min (ref >60.00)
GLUCOSE: 116 mg/dL — AB (ref 70–99)
POTASSIUM: 4.3 meq/L (ref 3.5–5.1)
SODIUM: 139 meq/L (ref 135–145)
TOTAL PROTEIN: 7 g/dL (ref 6.0–8.3)
Total Bilirubin: 0.4 mg/dL (ref 0.2–1.2)

## 2017-08-05 LAB — LIPID PANEL
Cholesterol: 215 mg/dL — ABNORMAL HIGH (ref 0–200)
HDL: 28.9 mg/dL — ABNORMAL LOW (ref >39.00)
NONHDL: 186.24
TRIGLYCERIDES: 353 mg/dL — AB (ref 0.0–149.0)
Total CHOL/HDL Ratio: 7
VLDL: 70.6 mg/dL — AB (ref 0.0–40.0)

## 2017-08-05 LAB — HEMOGLOBIN A1C: Hgb A1c MFr Bld: 6.6 % — ABNORMAL HIGH (ref 4.6–6.5)

## 2017-08-05 LAB — LDL CHOLESTEROL, DIRECT: LDL DIRECT: 134 mg/dL

## 2017-08-09 ENCOUNTER — Ambulatory Visit (INDEPENDENT_AMBULATORY_CARE_PROVIDER_SITE_OTHER): Payer: BLUE CROSS/BLUE SHIELD | Admitting: Primary Care

## 2017-08-09 ENCOUNTER — Encounter: Payer: Self-pay | Admitting: Primary Care

## 2017-08-09 VITALS — BP 126/78 | HR 64 | Temp 98.1°F | Ht 73.0 in | Wt 305.2 lb

## 2017-08-09 DIAGNOSIS — F411 Generalized anxiety disorder: Secondary | ICD-10-CM | POA: Diagnosis not present

## 2017-08-09 DIAGNOSIS — E119 Type 2 diabetes mellitus without complications: Secondary | ICD-10-CM | POA: Diagnosis not present

## 2017-08-09 DIAGNOSIS — Z0000 Encounter for general adult medical examination without abnormal findings: Secondary | ICD-10-CM | POA: Diagnosis not present

## 2017-08-09 DIAGNOSIS — E785 Hyperlipidemia, unspecified: Secondary | ICD-10-CM | POA: Diagnosis not present

## 2017-08-09 DIAGNOSIS — Z23 Encounter for immunization: Secondary | ICD-10-CM | POA: Diagnosis not present

## 2017-08-09 DIAGNOSIS — I10 Essential (primary) hypertension: Secondary | ICD-10-CM | POA: Diagnosis not present

## 2017-08-09 MED ORDER — AMLODIPINE BESYLATE 5 MG PO TABS: 5.0000 mg | ORAL_TABLET | Freq: Every day | ORAL | 3 refills | Status: DC

## 2017-08-09 MED ORDER — METFORMIN HCL 500 MG PO TABS: 500.0000 mg | ORAL_TABLET | Freq: Two times a day (BID) | ORAL | 3 refills | Status: DC

## 2017-08-09 MED ORDER — PAROXETINE HCL 20 MG PO TABS: 20.0000 mg | ORAL_TABLET | Freq: Every day | ORAL | 3 refills | Status: DC

## 2017-08-09 MED ORDER — ROSUVASTATIN CALCIUM 10 MG PO TABS
ORAL_TABLET | ORAL | 3 refills | Status: DC
Start: 2017-08-09 — End: 2017-09-04

## 2017-08-09 MED ORDER — LISINOPRIL 40 MG PO TABS: 40.0000 mg | ORAL_TABLET | Freq: Every day | ORAL | 3 refills | Status: DC

## 2017-08-09 NOTE — Patient Instructions (Addendum)
Start exercising. You should be getting 150 minutes of moderate intensity exercise weekly.  Work on [employer] as discussed. Limit fast food, soda, sweet tea, junk food. Increase vegetables, whole grains, lean protein.  Complete the Cologuard kit when received.  Follow up with the eye doctor as scheduled.   Schedule a lab only appointment in 6 weeks to recheck your cholesterol. Make sure to come fasting 4 hours prior.  Please schedule a follow up appointment in 6 months for diabetes check.   It was a pleasure to see you today!   Diabetes Mellitus and Nutrition When you have diabetes (diabetes mellitus), it is very important to have healthy eating habits because your blood sugar (glucose) levels are greatly affected by what you eat and drink. Eating healthy foods in the appropriate amounts, at about the same times every day, can help you:  Control your blood glucose.  Lower your risk of heart disease.  Improve your blood pressure.  Reach or maintain a healthy weight.  Every person with diabetes is different, and each person has different needs for a meal plan. Your health care provider may recommend that you work with a diet and nutrition specialist (dietitian) to make a meal plan that is best for you. Your meal plan may vary depending on factors such as:  The calories you need.  The medicines you take.  Your weight.  Your blood glucose, blood pressure, and cholesterol levels.  Your activity level.  Other health conditions you have, such as heart or kidney disease.  How do carbohydrates affect me? Carbohydrates affect your blood glucose level more than any other type of food. Eating carbohydrates naturally increases the amount of glucose in your blood. Carbohydrate counting is a method for keeping track of how many carbohydrates you eat. Counting carbohydrates is important to keep your blood glucose at a healthy level, especially if you use insulin or take certain oral diabetes  medicines. It is important to know how many carbohydrates you can safely have in each meal. This is different for every person. Your dietitian can help you calculate how many carbohydrates you should have at each meal and for snack. Foods that contain carbohydrates include:  Bread, cereal, rice, pasta, and crackers.  Potatoes and corn.  Peas, beans, and lentils.  Milk and yogurt.  Fruit and juice.  Desserts, such as cakes, cookies, ice cream, and candy.  How does alcohol affect me? Alcohol can cause a sudden decrease in blood glucose (hypoglycemia), especially if you use insulin or take certain oral diabetes medicines. Hypoglycemia can be a life-threatening condition. Symptoms of hypoglycemia (sleepiness, dizziness, and confusion) are similar to symptoms of having too much alcohol. If your health care provider says that alcohol is safe for you, follow these guidelines:  Limit alcohol intake to no more than 1 drink per day for nonpregnant women and 2 drinks per day for men. One drink equals 12 oz of beer, 5 oz of wine, or 1 oz of hard liquor.  Do not drink on an empty stomach.  Keep yourself hydrated with water, diet soda, or unsweetened iced tea.  Keep in mind that regular soda, juice, and other mixers may contain a lot of sugar and must be counted as carbohydrates.  What are tips for following this plan? Reading food labels  Start by checking the serving size on the label. The amount of calories, carbohydrates, fats, and other nutrients listed on the label are based on one serving of the food. Many foods  contain more than one serving per package.  Check the total grams (g) of carbohydrates in one serving. You can calculate the number of servings of carbohydrates in one serving by dividing the total carbohydrates by 15. For example, if a food has 30 g of total carbohydrates, it would be equal to 2 servings of carbohydrates.  Check the number of grams (g) of saturated and trans  fats in one serving. Choose foods that have low or no amount of these fats.  Check the number of milligrams (mg) of sodium in one serving. Most people should limit total sodium intake to less than 2,300 mg per day.  Always check the nutrition information of foods labeled as "low-fat" or "nonfat". These foods may be higher in added sugar or refined carbohydrates and should be avoided.  Talk to your dietitian to identify your daily goals for nutrients listed on the label. Shopping  Avoid buying canned, premade, or processed foods. These foods tend to be high in fat, sodium, and added sugar.  Shop around the outside edge of the grocery store. This includes fresh fruits and vegetables, bulk grains, fresh meats, and fresh dairy. Cooking  Use low-heat cooking methods, such as baking, instead of high-heat cooking methods like deep frying.  Cook using healthy oils, such as olive, canola, or sunflower oil.  Avoid cooking with butter, cream, or high-fat meats. Meal planning  Eat meals and snacks regularly, preferably at the same times every day. Avoid going long periods of time without eating.  Eat foods high in fiber, such as fresh fruits, vegetables, beans, and whole grains. Talk to your dietitian about how many servings of carbohydrates you can eat at each meal.  Eat 4-6 ounces of lean protein each day, such as lean meat, chicken, fish, eggs, or tofu. 1 ounce is equal to 1 ounce of meat, chicken, or fish, 1 egg, or 1/4 cup of tofu.  Eat some foods each day that contain healthy fats, such as avocado, nuts, seeds, and fish. Lifestyle   Check your blood glucose regularly.  Exercise at least 30 minutes 5 or more days each week, or as told by your health care provider.  Take medicines as told by your health care provider.  Do not use any products that contain nicotine or tobacco, such as cigarettes and e-cigarettes. If you need help quitting, ask your health care provider.  Work with a  Social worker or diabetes educator to identify strategies to manage stress and any emotional and social challenges. What are some questions to ask my health care provider?  Do I need to meet with a diabetes educator?  Do I need to meet with a dietitian?  What number can I call if I have questions?  When are the best times to check my blood glucose? Where to find more information:  American Diabetes Association: diabetes.org/food-and-fitness/food  Academy of Nutrition and Dietetics: PokerClues.dk  [employer] of Diabetes and Digestive and Kidney Diseases (NIH): ContactWire.be Summary  A healthy meal plan will help you control your blood glucose and maintain a healthy lifestyle.  Working with a diet and nutrition specialist (dietitian) can help you make a meal plan that is best for you.  Keep in mind that carbohydrates and alcohol have immediate effects on your blood glucose levels. It is important to count carbohydrates and to use alcohol carefully. This information is not intended to replace advice given to you by your health care provider. Make sure you discuss any questions you have with your  health care provider. Document Released: 10/05/2004 Document Revised: 02/13/2016 Document Reviewed: 02/13/2016 Elsevier Interactive Patient Education  [employer].

## 2017-08-09 NOTE — Assessment & Plan Note (Signed)
Without Crestor for 2 weeks, LDL above goal. Discussed the importance of a healthy diet and regular exercise in order for weight loss, and to reduce the risk of any potential medical problems. Resume Crestor, repeat lipids in 6 weeks.

## 2017-08-09 NOTE — Assessment & Plan Note (Signed)
Td UTD, pneumovax due and provided today. PSA UTD. Colon cancer screening due, declines colonoscopy, opts for Cologuard. Discussed the importance of a healthy diet and regular exercise in order for weight loss, and to reduce the risk of any potential medical problems. Exam unremarkable. Labs reviewed. Follow up in 1 year for CPE.

## 2017-08-09 NOTE — Assessment & Plan Note (Signed)
Stable in the office today, continue current regimen. 

## 2017-08-09 NOTE — Assessment & Plan Note (Signed)
Doing well on Paxil, continue same.

## 2017-08-09 NOTE — Addendum Note (Signed)
Addended by: Tawnya CrookSAMBATH, Lacie Landry on: 08/09/2017 12:43 PM   Modules accepted: Orders

## 2017-08-09 NOTE — Assessment & Plan Note (Addendum)
Recent A1C of 6.6. Started Metformin one month ago, twice daily. Continue same.   Managed on ACE.  Managed on Crestor, has not had in 2 weeks.  Pneumovax provided today. Eye exam due in August. Discussed the importance of a healthy diet and regular exercise in order for weight loss, and to reduce the risk of any potential medical problems.  Follow up in 6 months.

## 2017-08-09 NOTE — Progress Notes (Signed)
Subjective:    Patient ID: Jared Bradley, male    DOB: February 21, 1960, 57 y.o.   MRN: [id number]  HPI  Jared Bradley is a 57 year old male who presents today for complete physical.  Immunizations: -Tetanus: Completed in 2016 -Influenza: Completed last season -Pneumonia: Due  Diet: He endorses a poor diet Breakfast: Fast food Lunch: Fast food Dinner: Subs, meat, vegetable, starch Snacks: Junk food, chips, cookies Desserts: Daily  Beverages: Water, soda, sweet tea  Exercise: He is not exercising, he is active with work Eye exam: Scheduled for August Dental exam: Completes annually  Colonoscopy: Never completed. Declines. Opt for Cologuard PSA: Negative in 2018 Hep C Screen: Negative in 2018  The 10-year ASCVD risk score Denman George(Goff DC Jr., et al., 2013) is: 19%   Values used to calculate the score:     Age: [age] years     Sex: Male     Is Non-Hispanic African American: No     Diabetic: Yes     Tobacco smoker: No     Systolic Blood Pressure: 126 mmHg     Is BP treated: Yes     HDL Cholesterol: 28.9 mg/dL     Total Cholesterol: 215 mg/dL  BP Readings from Last 3 Encounters:  08/09/17 126/78  01/14/17 122/76  05/18/16 122/74      Review of Systems  Constitutional: Negative for unexpected weight change.  HENT: Negative for rhinorrhea.   Respiratory: Negative for cough and shortness of breath.   Cardiovascular: Negative for chest pain.  Gastrointestinal: Negative for constipation and diarrhea.  Genitourinary: Negative for difficulty urinating.  Musculoskeletal: Negative for arthralgias and myalgias.  Skin: Negative for rash.  Allergic/Immunologic: Negative for environmental allergies.  Neurological: Negative for dizziness, numbness and headaches.  Psychiatric/Behavioral:       Doing well on Paxil       Past Medical History:  Diagnosis Date  . Anxiety   . Hyperlipidemia   . Hypertension      Social History   Socioeconomic History  . Marital status:  Married    Spouse name: Not on file  . Number of children: Not on file  . Years of education: Not on file  . Highest education level: Not on file  Occupational History  . Not on file  Social Needs  . Financial resource strain: Not on file  . Food insecurity:    Worry: Not on file    Inability: Not on file  . Transportation needs:    Medical: Not on file    Non-medical: Not on file  Tobacco Use  . Smoking status: Former Games developermoker  . Smokeless tobacco: Never Used  Substance and Sexual Activity  . Alcohol use: No  . Drug use: No  . Sexual activity: Not on file  Lifestyle  . Physical activity:    Days per week: Not on file    Minutes per session: Not on file  . Stress: Not on file  Relationships  . Social connections:    Talks on phone: Not on file    Gets together: Not on file    Attends religious service: Not on file    Active member of club or organization: Not on file    Attends meetings of clubs or organizations: Not on file    Relationship status: Not on file  . Intimate partner violence:    Fear of current or ex partner: Not on file    Emotionally abused: Not on file  Physically abused: Not on file    Forced sexual activity: Not on file  Other Topics Concern  . Not on file  Social History Narrative   Married.   3 children.   Works as a Naval architect   Enjoys hunting    No past surgical history on file.  Family History  Problem Relation Age of Onset  . Heart disease Father   . Hypertension Father   . Hyperlipidemia Father   . Heart disease Sister   . Diabetes Brother   . Hypertension Maternal Grandmother     Allergies  Allergen Reactions  . Lipitor [Atorvastatin] Other (See Comments)    Myalgias    Current Outpatient Medications on File Prior to Visit  Medication Sig Dispense Refill  . ASPIRIN 81 PO Take 81 mg by mouth daily.     No current facility-administered medications on file prior to visit.     BP 126/78   Pulse 64   Temp 98.1 F  (36.7 C) (Oral)   Ht 6\' 1"  (1.854 m)   Wt (!) 305 lb 4 oz (138.5 kg)   SpO2 97%   BMI 40.27 kg/m    Objective:   Physical Exam  Constitutional: He is oriented to person, place, and time. He appears well-nourished.  HENT:  Mouth/Throat: No oropharyngeal exudate.  Eyes: Pupils are equal, round, and reactive to light. EOM are normal.  Neck: Neck supple. No thyromegaly present.  Cardiovascular: Normal rate and regular rhythm.  Respiratory: Effort normal and breath sounds normal.  GI: Soft. Bowel sounds are normal. There is no tenderness.  Musculoskeletal: Normal range of motion.  Neurological: He is alert and oriented to person, place, and time.  Skin: Skin is warm and dry.  Psychiatric: He has a normal mood and affect.           Assessment & Plan:

## 2017-08-16 ENCOUNTER — Ambulatory Visit (INDEPENDENT_AMBULATORY_CARE_PROVIDER_SITE_OTHER): Payer: BLUE CROSS/BLUE SHIELD | Admitting: Primary Care

## 2017-08-16 ENCOUNTER — Encounter: Payer: Self-pay | Admitting: Primary Care

## 2017-08-16 VITALS — BP 126/80 | HR 68 | Temp 97.8°F | Ht 73.0 in | Wt 306.5 lb

## 2017-08-16 DIAGNOSIS — L237 Allergic contact dermatitis due to plants, except food: Secondary | ICD-10-CM

## 2017-08-16 MED ORDER — METHYLPREDNISOLONE ACETATE 80 MG/ML IJ SUSP
80.0000 mg | Freq: Once | INTRAMUSCULAR | Status: AC
  Administered 2017-08-16: 80 mg via INTRAMUSCULAR

## 2017-08-16 NOTE — Patient Instructions (Signed)
You were provided with an injection of steroids today.  Please call me if the rash returns or does not disappear completely.  Make sure all linens are washed.  It was a pleasure to see you today!   Poison Ivy Dermatitis Poison ivy dermatitis is redness and soreness (inflammation) of the skin. It is caused by a chemical that is found on the leaves of the poison ivy plant. You may also have itching, a rash, and blisters. Symptoms often clear up in 1-2 weeks. You may get this condition by touching a poison ivy plant. You can also get it by touching something that has the chemical on it. This may include animals or objects that have come in contact with the plant. Follow these instructions at home: General instructions  Take or apply over-the-counter and prescription medicines only as told by your doctor.  If you touch poison ivy, wash your skin with soap and cold water right away.  Use hydrocortisone creams or calamine lotion as needed to help with itching.  Take oatmeal baths as needed. Use colloidal oatmeal. You can get this at a pharmacy or grocery store. Follow the instructions on the package.  Do not scratch or rub your skin.  While you have the rash, wash your clothes right after you wear them. Prevention  Know what poison ivy looks like so you can avoid it. This plant has three leaves with flowering branches on a single stem. The leaves are glossy. They have uneven edges that come to a point at the front.  If you have touched poison ivy, wash with soap and water right away. Be sure to wash under your fingernails.  When hiking or camping, wear long pants, a long-sleeved shirt, tall socks, and hiking boots. You can also use a lotion on your skin that helps to prevent contact with the chemical on the plant.  If you think that your clothes or outdoor gear came in contact with poison ivy, rinse them off with a garden hose before you bring them inside your house. Contact a doctor  if:  You have open sores in the rash area.  You have more redness, swelling, or pain in the affected area.  You have redness that spreads beyond the rash area.  You have fluid, blood, or pus coming from the affected area.  You have a fever.  You have a rash over a large area of your body.  You have a rash on your eyes, mouth, or genitals.  Your rash does not get better after a few days. Get help right away if:  Your face swells or your eyes swell shut.  You have trouble breathing.  You have trouble swallowing. This information is not intended to replace advice given to you by your health care provider. Make sure you discuss any questions you have with your health care provider. Document Released: 02/10/2010 Document Revised: 06/16/2015 Document Reviewed: 06/16/2014 Elsevier Interactive Patient Education  [employer]2018 Elsevier Inc.

## 2017-08-16 NOTE — Addendum Note (Signed)
Addended by: Nanci PinaGOINS, Sandeep Radell on: 08/16/2017 09:28 AM   Modules accepted: Orders

## 2017-08-16 NOTE — Progress Notes (Signed)
Subjective:    Patient ID: Jared Bradley, male    DOB: 10-24-1960, 57 y.o.   MRN: [id number]  HPI  Jared Bradley is a 57 year old male who presents today with a chief complaint of rash.  He was out in the yard one week ago and thinks he encountered poison ivy. Four days later he noticed a rash develop to his upper extremities. The rash has continued to spread to his legs, ankles, neck, face, back since. He has not taken anything OTC.   He has a history of poison ivy dermatitis as a child, received "a shot" with complete resolve. He denies wheezing, shortness of breath. He has washed all clothing.   Review of Systems  Constitutional: Negative for fever.  Respiratory: Negative for shortness of breath and wheezing.   Skin: Positive for rash.       Past Medical History:  Diagnosis Date  . Anxiety   . Hyperlipidemia   . Hypertension      Social History   Socioeconomic History  . Marital status: Married    Spouse name: Not on file  . Number of children: Not on file  . Years of education: Not on file  . Highest education level: Not on file  Occupational History  . Not on file  Social Needs  . Financial resource strain: Not on file  . Food insecurity:    Worry: Not on file    Inability: Not on file  . Transportation needs:    Medical: Not on file    Non-medical: Not on file  Tobacco Use  . Smoking status: Former Games developermoker  . Smokeless tobacco: Never Used  Substance and Sexual Activity  . Alcohol use: No  . Drug use: No  . Sexual activity: Not on file  Lifestyle  . Physical activity:    Days per week: Not on file    Minutes per session: Not on file  . Stress: Not on file  Relationships  . Social connections:    Talks on phone: Not on file    Gets together: Not on file    Attends religious service: Not on file    Active member of club or organization: Not on file    Attends meetings of clubs or organizations: Not on file    Relationship status: Not on file    . Intimate partner violence:    Fear of current or ex partner: Not on file    Emotionally abused: Not on file    Physically abused: Not on file    Forced sexual activity: Not on file  Other Topics Concern  . Not on file  Social History Narrative   Married.   3 children.   Works as a Naval architectTruck Driver   Enjoys hunting    No past surgical history on file.  Family History  Problem Relation Age of Onset  . Heart disease Father   . Hypertension Father   . Hyperlipidemia Father   . Heart disease Sister   . Diabetes Brother   . Hypertension Maternal Grandmother     Allergies  Allergen Reactions  . Hydrochlorothiazide W-Triamterene Nausea Only  . Rosuvastatin Other (See Comments)    Started after 5 years of treatment  . Lipitor [Atorvastatin] Other (See Comments)    Myalgias    Current Outpatient Medications on File Prior to Visit  Medication Sig Dispense Refill  . amLODipine (NORVASC) 5 MG tablet Take 1 tablet (5 mg total) by mouth daily. 90  tablet 3  . ASPIRIN 81 PO Take 81 mg by mouth daily.    Marland Kitchen lisinopril (PRINIVIL,ZESTRIL) 40 MG tablet Take 1 tablet (40 mg total) by mouth daily. 90 tablet 3  . metFORMIN (GLUCOPHAGE) 500 MG tablet Take 1 tablet (500 mg total) by mouth 2 (two) times daily with a meal. 180 tablet 3  . PARoxetine (PAXIL) 20 MG tablet Take 1 tablet (20 mg total) by mouth daily. 90 tablet 3  . rosuvastatin (CRESTOR) 10 MG tablet Take 1 tablet by mouth every evening for cholesterol. 90 tablet 3   No current facility-administered medications on file prior to visit.     BP 126/80 (BP Location: Left Arm, Patient Position: Sitting, Cuff Size: Large)   Pulse 68   Temp 97.8 F (36.6 C) (Oral)   Ht 6\' 1"  (1.854 m)   Wt (!) 306 lb 8 oz (139 kg)   SpO2 97%   BMI 40.44 kg/m    Objective:   Physical Exam  Constitutional: He appears well-nourished.  Neck: Neck supple.  Cardiovascular: Normal rate and regular rhythm.  Respiratory: Effort normal and breath sounds  normal.  Skin: Skin is warm and dry.  Raised, red, bumpy rash to bilateral upper extremities, neck left lateral cheek, lower extremities.            Assessment & Plan:  Poison Ivy Dermatitis:  Mild rash to extremities, neck, left face.  Representative of poison ivy dermatitis. He endorses that the injection is historically enough to eradicate symptoms. IM Depo Medrol 80 mg provided today. Discussed for him to call if the rash returns as he may also need a prednisone taper. Home care instructions provided.  Doreene Nest, NP

## 2017-08-18 ENCOUNTER — Encounter: Payer: Self-pay | Admitting: Primary Care

## 2017-08-19 ENCOUNTER — Telehealth: Payer: Self-pay | Admitting: Primary Care

## 2017-08-19 DIAGNOSIS — L237 Allergic contact dermatitis due to plants, except food: Secondary | ICD-10-CM

## 2017-08-19 MED ORDER — PREDNISONE 20 MG PO TABS: ORAL_TABLET | ORAL | 0 refills | Status: DC

## 2017-08-19 NOTE — Telephone Encounter (Signed)
Spoken and notified patient of Kate Clark's comments. Patient verbalized understanding.  

## 2017-08-19 NOTE — Telephone Encounter (Signed)
Copied from CRM [id number]. Topic: Quick Communication - See Telephone Encounter >> Aug 19, 2017  8:20 AM Maia Pettiesrtiz, Kristie S wrote: CRM for notification. See Telephone encounter for: 08/19/17. Pt states that he had a shot for poison ivy Friday. He hasn't had any improvement at all. If anything he states it has gotten a little worse. He said Jae DireKate advised if no improvement a prescription could be sent in. Please call to notify pt of RX being sent in for him.  [id number] [address] [place], [place] - [id number] [phone] (Phone) [phone] (Fax)

## 2017-08-19 NOTE — Telephone Encounter (Signed)
Noted. Thank him for the update. Start prednisone tablets. Take three tablets for 2 days, then two tablets for 4 days, then one tablet for 4 days.

## 2017-08-19 NOTE — Telephone Encounter (Signed)
Pt last seen 08/16/17.Please advise.

## 2017-08-23 ENCOUNTER — Other Ambulatory Visit: Payer: Self-pay | Admitting: Primary Care

## 2017-08-23 DIAGNOSIS — E119 Type 2 diabetes mellitus without complications: Secondary | ICD-10-CM

## 2017-08-24 ENCOUNTER — Other Ambulatory Visit: Payer: Self-pay | Admitting: Primary Care

## 2017-08-24 DIAGNOSIS — I10 Essential (primary) hypertension: Secondary | ICD-10-CM

## 2017-08-26 ENCOUNTER — Other Ambulatory Visit: Payer: Self-pay | Admitting: Primary Care

## 2017-08-26 DIAGNOSIS — F411 Generalized anxiety disorder: Secondary | ICD-10-CM

## 2017-08-26 DIAGNOSIS — L237 Allergic contact dermatitis due to plants, except food: Secondary | ICD-10-CM

## 2017-08-26 DIAGNOSIS — I10 Essential (primary) hypertension: Secondary | ICD-10-CM

## 2017-08-26 MED ORDER — PAROXETINE HCL 20 MG PO TABS
20.0000 mg | ORAL_TABLET | Freq: Every day | ORAL | 0 refills | Status: DC
Start: 2017-08-26 — End: 2018-09-30

## 2017-08-26 MED ORDER — AMLODIPINE BESYLATE 5 MG PO TABS: 5.0000 mg | ORAL_TABLET | Freq: Every day | ORAL | 0 refills | Status: DC

## 2017-08-26 NOTE — Telephone Encounter (Signed)
Pt requesting a refill of Prednisone 20mg  tab, last filled on 08/19/17 #18  LOV: 08/16/17 PCP: Eldridge DaceK. Clark,NP  [facility] [address] [place]  30 day supply of Amlodipine(Norvasc) and Paroxetine(Paxil) [facility] [address] as requested by pt.

## 2017-08-26 NOTE — Telephone Encounter (Signed)
Copied from CRM [id number]. Topic: Quick Communication - Rx Refill/Question >> Aug 26, 2017  1:42 PM Burchel, Abbi R wrote: Medication:  amLODipine (NORVASC) 5 MG tablet, PARoxetine (PAXIL) 20 MG tablet, predniSONE (DELTASONE) 20 MG tablet  Pt has ordered these medications from his mail order pharmacy, but has been advised by them to request a 30 day supply of these medications so as not to run out before his mail-order rxs arrive.    Preferred Pharmacy: [id number] [address]- [place], [place] - [address] [place] [id number] Phone: [phone] Fax: [phone]

## 2017-08-27 ENCOUNTER — Other Ambulatory Visit: Payer: Self-pay | Admitting: Primary Care

## 2017-08-27 DIAGNOSIS — I10 Essential (primary) hypertension: Secondary | ICD-10-CM

## 2017-09-04 ENCOUNTER — Telehealth: Payer: Self-pay | Admitting: Primary Care

## 2017-09-04 ENCOUNTER — Other Ambulatory Visit: Payer: Self-pay | Admitting: Primary Care

## 2017-09-04 DIAGNOSIS — E119 Type 2 diabetes mellitus without complications: Secondary | ICD-10-CM

## 2017-09-04 DIAGNOSIS — E785 Hyperlipidemia, unspecified: Secondary | ICD-10-CM

## 2017-09-04 MED ORDER — METFORMIN HCL 500 MG PO TABS: 500.0000 mg | ORAL_TABLET | Freq: Two times a day (BID) | ORAL | 0 refills | Status: DC

## 2017-09-04 MED ORDER — ROSUVASTATIN CALCIUM 10 MG PO TABS: ORAL_TABLET | ORAL | 0 refills | Status: DC

## 2017-09-04 NOTE — Telephone Encounter (Signed)
Copied from CRM [phone]. Topic: Quick Communication - Rx Refill/Question >> Sep 04, 2017 10:56 AM Tamela OddiHarris, Brenda J wrote: Patient returned call to state that he still would like his prescriptions through the mail order, but they contacted him and said that it would not be processed until 30 days.  Therefore the patient would like a 30 day supply sent [facility] until the [facility] order is processed.  Please advised.  CB# [phone].

## 2017-09-04 NOTE — Telephone Encounter (Signed)
Refill send as requested and patient was notified.

## 2017-09-04 NOTE — Telephone Encounter (Signed)
Left message for patient- Rx were sent to mail order pharmacy- is he changing pharmacy? Please call office to verify he is changing before Rx is moved.

## 2017-09-04 NOTE — Telephone Encounter (Signed)
Copied from CRM #[id number]. Topic: Quick Communication - Rx Refill/Question >> Aug 14, [id number] 10:12 AM Arlyss Gandyichardson, Carlo Guevarra N, NT wrote: Medication: rosuvastatin (CRESTOR) 10 MG tablet and metFORMIN (GLUCOPHAGE) 500 MG tablet   Has the patient contacted their pharmacy? Yes.   (Agent: If no, request that the patient contact the pharmacy for the refill.) (Agent: If yes, when and what did the pharmacy advise?)  Preferred Pharmacy (with phone number or street name): [id number] [address] , [place] - [address] [phone] (Phone) [phone] (Fax)    Agent: Please be advised that RX refills may take up to 3 business days. We ask that you follow-up with your pharmacy.

## 2017-09-20 ENCOUNTER — Other Ambulatory Visit: Payer: BLUE CROSS/BLUE SHIELD

## 2017-09-22 ENCOUNTER — Other Ambulatory Visit: Payer: Self-pay | Admitting: Primary Care

## 2017-09-22 DIAGNOSIS — I10 Essential (primary) hypertension: Secondary | ICD-10-CM

## 2017-09-27 ENCOUNTER — Other Ambulatory Visit: Payer: BLUE CROSS/BLUE SHIELD

## 2017-09-27 LAB — HM DIABETES EYE EXAM

## 2017-10-01 ENCOUNTER — Other Ambulatory Visit: Payer: Self-pay | Admitting: Primary Care

## 2017-10-01 DIAGNOSIS — E119 Type 2 diabetes mellitus without complications: Secondary | ICD-10-CM

## 2017-10-02 ENCOUNTER — Other Ambulatory Visit: Payer: Self-pay | Admitting: Primary Care

## 2017-10-02 DIAGNOSIS — E785 Hyperlipidemia, unspecified: Secondary | ICD-10-CM

## 2017-10-03 ENCOUNTER — Encounter: Payer: Self-pay | Admitting: Primary Care

## 2017-10-26 ENCOUNTER — Other Ambulatory Visit: Payer: Self-pay | Admitting: Primary Care

## 2017-10-26 DIAGNOSIS — I10 Essential (primary) hypertension: Secondary | ICD-10-CM

## 2017-12-23 ENCOUNTER — Other Ambulatory Visit (INDEPENDENT_AMBULATORY_CARE_PROVIDER_SITE_OTHER): Payer: BLUE CROSS/BLUE SHIELD

## 2017-12-23 DIAGNOSIS — E785 Hyperlipidemia, unspecified: Secondary | ICD-10-CM | POA: Diagnosis not present

## 2017-12-23 LAB — LIPID PANEL
CHOL/HDL RATIO: 4
CHOLESTEROL: 147 mg/dL (ref 0–200)
HDL: 32.6 mg/dL — ABNORMAL LOW (ref >39.00)
LDL Cholesterol: 86 mg/dL (ref 0–99)
NonHDL: 114.07
Triglycerides: 138 mg/dL (ref 0.0–149.0)
VLDL: 27.6 mg/dL (ref 0.0–40.0)

## 2017-12-23 LAB — HEPATIC FUNCTION PANEL
ALK PHOS: 83 U/L (ref 39–117)
ALT: 39 U/L (ref 0–53)
AST: 31 U/L (ref 0–37)
Albumin: 4.3 g/dL (ref 3.5–5.2)
BILIRUBIN DIRECT: 0.1 mg/dL (ref 0.0–0.3)
TOTAL PROTEIN: 7.2 g/dL (ref 6.0–8.3)
Total Bilirubin: 0.6 mg/dL (ref 0.2–1.2)

## 2017-12-25 ENCOUNTER — Ambulatory Visit (INDEPENDENT_AMBULATORY_CARE_PROVIDER_SITE_OTHER): Payer: BLUE CROSS/BLUE SHIELD | Admitting: Primary Care

## 2017-12-25 ENCOUNTER — Encounter: Payer: Self-pay | Admitting: Primary Care

## 2017-12-25 DIAGNOSIS — I10 Essential (primary) hypertension: Secondary | ICD-10-CM

## 2017-12-25 MED ORDER — AMLODIPINE BESYLATE 10 MG PO TABS: 10.0000 mg | ORAL_TABLET | Freq: Every day | ORAL | 0 refills | Status: DC

## 2017-12-25 NOTE — Assessment & Plan Note (Signed)
Needing clearance for DOT card, BP above goal during DOT physical and in the office today. Increase Amlodipine to 10 mg, continue lisinopril 40 mg.   He will return in 2 weeks for BP check. If he's met the requirements then we will sign his form.

## 2017-12-25 NOTE — Patient Instructions (Signed)
We've increased your amlodipine to 10 mg. You may take 2 of the 5 mg tablets to equal 10 mg until your bottle is empty.   Continue lisinopril 40 mg.   Start monitoring your blood pressure daily, around the same time of day, for the next 2-3weeks.  Ensure that you have rested for 30 minutes prior to checking your blood pressure. Record your readings and bring them to your next visit.  Schedule a follow up visit in 2 weeks.  It was a pleasure to see you today!

## 2017-12-25 NOTE — Progress Notes (Signed)
Subjective:    Patient ID: Jared Bradley, male    DOB: [birth year], 57 y.o.   MRN: [id number]  HPI  [name] is a 57 year old male who presents today to discuss blood pressure.  BP Readings from Last 3 Encounters:  12/25/17 (!) 142/80  08/16/17 126/80  08/09/17 126/78   He is currently managed on lisinopril 40 mg and amlodipine 5 mg. He endorses compliance to both medications and gets them through mail order. 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 He is needing a form signed to approve him for a DOT card, his BP must be at or below 140/90 in order to pass.   He denies chest pain, dizziness, shortness of breath. He does not check his BP at home.   Wt Readings from Last 3 Encounters:  12/25/17 (!) 304 lb 8 oz (138.1 kg)  08/16/17 (!) 306 lb 8 oz (139 kg)  08/09/17 (!) 305 lb 4 oz (138.5 kg)     Review of Systems  Eyes: Negative for visual disturbance.  Respiratory: Negative for shortness of breath.   Cardiovascular: Negative for chest pain.  Neurological: Negative for dizziness and headaches.       Past Medical History:  Diagnosis Date  . Anxiety   . Hyperlipidemia   . Hypertension      Social History   Socioeconomic History  . Marital status: Married    Spouse name: Not on file  . Number of children: Not on file  . Years of education: Not on file  . Highest education level: Not on file  Occupational History  . Not on file  Social Needs  . Financial resource strain: Not on file  . Food insecurity:    Worry: Not on file    Inability: Not on file  . Transportation needs:    Medical: Not on file    Non-medical: Not on file  Tobacco Use  . Smoking status: Former Games developer  . Smokeless tobacco: Never Used  Substance and Sexual Activity  . Alcohol use: No  . Drug use: No  . Sexual activity: Not on file  Lifestyle  . Physical activity:    Days per week: Not on file    Minutes per session: Not on  file  . Stress: Not on file  Relationships  . Social connections:    Talks on phone: Not on file    Gets together: Not on file    Attends religious service: Not on file    Active member of club or organization: Not on file    Attends meetings of clubs or organizations: Not on file    Relationship status: Not on file  . Intimate partner violence:    Fear of current or ex partner: Not on file    Emotionally abused: Not on file    Physically abused: Not on file    Forced sexual activity: Not on file  Other Topics Concern  . Not on file  Social History Narrative   Married.   3 children.   Works as a Naval architect   Enjoys hunting    No past surgical history on file.  Family History  Problem Relation Age of Onset  . Heart disease Father   . Hypertension Father   . Hyperlipidemia Father   . Heart disease Sister   . Diabetes Brother   . Hypertension Maternal Grandmother     Allergies  Allergen Reactions  .  Hydrochlorothiazide W-Triamterene Nausea Only  . Rosuvastatin Other (See Comments)    Started after 5 years of treatment  . Lipitor [Atorvastatin] Other (See Comments)    Myalgias    Current Outpatient Medications on File Prior to Visit  Medication Sig Dispense Refill  . amLODipine (NORVASC) 5 MG tablet Take 1 tablet (5 mg total) by mouth daily. 30 tablet 0  . ASPIRIN 81 PO Take 81 mg by mouth daily.    Marland Kitchen. lisinopril (PRINIVIL,ZESTRIL) 40 MG tablet Take 1 tablet (40 mg total) by mouth daily. 90 tablet 3  . metFORMIN (GLUCOPHAGE) 500 MG tablet Take 1 tablet (500 mg total) by mouth 2 (two) times daily with a meal. 60 tablet 0  . PARoxetine (PAXIL) 20 MG tablet Take 1 tablet (20 mg total) by mouth daily. 30 tablet 0  . rosuvastatin (CRESTOR) 10 MG tablet Take 1 tablet by mouth every evening for cholesterol. 30 tablet 0   No current facility-administered medications on file prior to visit.     BP (!) 142/80   Pulse 74   Temp 98 F (36.7 C) (Oral)   Ht 6\' 1"  (1.854 m)    Wt (!) 304 lb 8 oz (138.1 kg)   SpO2 97%   BMI 40.17 kg/m    Objective:   Physical Exam  Constitutional: He appears well-nourished.  Neck: Neck supple.  Cardiovascular: Normal rate and regular rhythm.  Respiratory: Effort normal and breath sounds normal.  Skin: Skin is warm and dry.           Assessment & Plan:

## 2018-01-10 ENCOUNTER — Encounter: Payer: Self-pay | Admitting: Primary Care

## 2018-01-10 ENCOUNTER — Ambulatory Visit (INDEPENDENT_AMBULATORY_CARE_PROVIDER_SITE_OTHER): Payer: BLUE CROSS/BLUE SHIELD | Admitting: Primary Care

## 2018-01-10 VITALS — BP 140/76 | HR 76 | Temp 98.3°F | Ht 73.0 in | Wt 307.8 lb

## 2018-01-10 DIAGNOSIS — I10 Essential (primary) hypertension: Secondary | ICD-10-CM

## 2018-01-10 MED ORDER — HYDROCHLOROTHIAZIDE 25 MG PO TABS: 25.0000 mg | ORAL_TABLET | Freq: Every day | ORAL | 0 refills | Status: DC

## 2018-01-10 NOTE — Progress Notes (Signed)
Subjective:    Patient ID: Jared Bradley, male    DOB: 1960-05-10, 57 y.o.   MRN: [id number]  HPI  Jared Bradley is a [age] year old male who presents today for follow up of hypertension.  He was last evaluated on 12/25/17 needing clearance for DOT card as he did not pass the blood pressure requirements. His BP was elevated both in the office and also during his DOT physical so he was continued on Lisinopril 40 mg and we increased his Amlodipine to 10 mg.   Since his last visit he's compliant to his medications. He does feel nervous today about his BP readings as he needs his DOT card for work which is due on 01/16/18.  He denies chest pain, dizziness.   BP Readings from Last 3 Encounters:  01/10/18 140/76  12/25/17 (!) 142/80  08/16/17 126/80     Review of Systems  Respiratory: Negative for shortness of breath.   Cardiovascular: Negative for chest pain.  Neurological: Negative for dizziness and headaches.       Past Medical History:  Diagnosis Date  . Anxiety   . Hyperlipidemia   . Hypertension      Social History   Socioeconomic History  . Marital status: Married    Spouse name: Not on file  . Number of children: Not on file  . Years of education: Not on file  . Highest education level: Not on file  Occupational History  . Not on file  Social Needs  . Financial resource strain: Not on file  . Food insecurity:    Worry: Not on file    Inability: Not on file  . Transportation needs:    Medical: Not on file    Non-medical: Not on file  Tobacco Use  . Smoking status: Former Games developermoker  . Smokeless tobacco: Never Used  Substance and Sexual Activity  . Alcohol use: No  . Drug use: No  . Sexual activity: Not on file  Lifestyle  . Physical activity:    Days per week: Not on file    Minutes per session: Not on file  . Stress: Not on file  Relationships  . Social connections:    Talks on phone: Not on file    Gets together: Not on file    Attends religious  service: Not on file    Active member of club or organization: Not on file    Attends meetings of clubs or organizations: Not on file    Relationship status: Not on file  . Intimate partner violence:    Fear of current or ex partner: Not on file    Emotionally abused: Not on file    Physically abused: Not on file    Forced sexual activity: Not on file  Other Topics Concern  . Not on file  Social History Narrative   Married.   3 children.   Works as a Naval architectTruck Driver   Enjoys hunting    No past surgical history on file.  Family History  Problem Relation Age of Onset  . Heart disease Father   . Hypertension Father   . Hyperlipidemia Father   . Heart disease Sister   . Diabetes Brother   . Hypertension Maternal Grandmother     Allergies  Allergen Reactions  . Hydrochlorothiazide W-Triamterene Nausea Only  . Rosuvastatin Other (See Comments)    Started after 5 years of treatment  . Lipitor [Atorvastatin] Other (See Comments)    Myalgias  Current Outpatient Medications on File Prior to Visit  Medication Sig Dispense Refill  . amLODipine (NORVASC) 10 MG tablet Take 1 tablet (10 mg total) by mouth daily. For blood pressure. 90 tablet 0  . ASPIRIN 81 PO Take 81 mg by mouth daily.    Marland Kitchen. lisinopril (PRINIVIL,ZESTRIL) 40 MG tablet Take 1 tablet (40 mg total) by mouth daily. 90 tablet 3  . metFORMIN (GLUCOPHAGE) 500 MG tablet Take 1 tablet (500 mg total) by mouth 2 (two) times daily with a meal. 60 tablet 0  . PARoxetine (PAXIL) 20 MG tablet Take 1 tablet (20 mg total) by mouth daily. 30 tablet 0  . rosuvastatin (CRESTOR) 10 MG tablet Take 1 tablet by mouth every evening for cholesterol. 30 tablet 0   No current facility-administered medications on file prior to visit.     BP 140/76   Pulse 76   Temp 98.3 F (36.8 C) (Oral)   Ht 6\' 1"  (1.854 m)   Wt (!) 307 lb 12 oz (139.6 kg)   SpO2 97%   BMI 40.60 kg/m    Objective:   Physical Exam  Constitutional: He appears  well-nourished.  Neck: Neck supple.  Cardiovascular: Normal rate and regular rhythm.  Respiratory: Effort normal and breath sounds normal.  Skin: Skin is warm and dry.           Assessment & Plan:

## 2018-01-10 NOTE — Assessment & Plan Note (Signed)
Hardly improved with increased dose of Amlodipine to 10 mg.  Continue Amlodipine 10 mg and Lisinopril 40 mg, add HCTZ 25 mg. Follow up in 2 weeks for BP check and BMP. Will write letter for DOT card extension until he can be seen again in 2 weeks.

## 2018-01-10 NOTE — Patient Instructions (Signed)
Start hydrochlorothiazide 25 mg tablets for high blood pressure.  Continue taking lisinopril 40 mg and Amlodipine 10 mg daily for blood pressure.  Start monitoring your blood pressure daily, around the same time of day, for the next 2-3weeks.  Ensure that you have rested for 30 minutes prior to checking your blood pressure. Record your readings and bring them to your next visit.  Schedule a follow up visit in 2 weeks for blood pressure check.  It was a pleasure to see you today!

## 2018-01-24 ENCOUNTER — Encounter: Payer: Self-pay | Admitting: Primary Care

## 2018-01-24 ENCOUNTER — Ambulatory Visit (INDEPENDENT_AMBULATORY_CARE_PROVIDER_SITE_OTHER): Payer: BLUE CROSS/BLUE SHIELD | Admitting: Primary Care

## 2018-01-24 DIAGNOSIS — I10 Essential (primary) hypertension: Secondary | ICD-10-CM | POA: Diagnosis not present

## 2018-01-24 LAB — BASIC METABOLIC PANEL
BUN: 16 mg/dL (ref 6–23)
CALCIUM: 9.9 mg/dL (ref 8.4–10.5)
CO2: 28 mEq/L (ref 19–32)
CREATININE: 0.96 mg/dL (ref 0.40–1.50)
Chloride: 101 mEq/L (ref 96–112)
GFR: 86.32 mL/min (ref >60.00)
Glucose, Bld: 122 mg/dL — ABNORMAL HIGH (ref 70–99)
Potassium: 4.3 mEq/L (ref 3.5–5.1)
Sodium: 138 mEq/L (ref 135–145)

## 2018-01-24 MED ORDER — HYDROCHLOROTHIAZIDE 25 MG PO TABS: 25.0000 mg | ORAL_TABLET | Freq: Every day | ORAL | 3 refills | Status: DC

## 2018-01-24 NOTE — Patient Instructions (Signed)
Continue taking Amlodipine 10 mg, Hydrochlorothiazide 25 mg, and Lisinopril 40 mg for blood pressure.  Stop by the lab prior to leaving today. I will notify you of your results once received.   It was a pleasure to see you today!

## 2018-01-24 NOTE — Progress Notes (Signed)
Subjective:    Patient ID: Jared Bradley, male    DOB: 08/31/1960, 58 y.o.   MRN: [id number]  HPI  Jared Bradley is a 59 year old male who presents today for follow up of hypertension.  He was last evaluated on 01/10/18 for uncontrolled hypertension with dose increase of Amlodipine to 10 mg. He is pending approval for a DOT card for work and is needing clearance. HCTZ 25 mg was added to his regimen of Amlodipine 10 mg and Lisinopril 40 mg.   Since his last visit he's checked his BP once which was 122/70's. He denies dizziness, headaches, chest pain. He is compliant to his BP regimen.   BP Readings from Last 3 Encounters:  01/24/18 122/72  01/10/18 140/76  12/25/17 (!) 142/80     Review of Systems  Respiratory: Negative for shortness of breath.   Cardiovascular: Negative for chest pain.  Neurological: Negative for dizziness and headaches.       Past Medical History:  Diagnosis Date  . Anxiety   . Hyperlipidemia   . Hypertension      Social History   Socioeconomic History  . Marital status: Married    Spouse name: Not on file  . Number of children: Not on file  . Years of education: Not on file  . Highest education level: Not on file  Occupational History  . Not on file  Social Needs  . Financial resource strain: Not on file  . Food insecurity:    Worry: Not on file    Inability: Not on file  . Transportation needs:    Medical: Not on file    Non-medical: Not on file  Tobacco Use  . Smoking status: Former Games developer  . Smokeless tobacco: Never Used  Substance and Sexual Activity  . Alcohol use: No  . Drug use: No  . Sexual activity: Not on file  Lifestyle  . Physical activity:    Days per week: Not on file    Minutes per session: Not on file  . Stress: Not on file  Relationships  . Social connections:    Talks on phone: Not on file    Gets together: Not on file    Attends religious service: Not on file    Active member of club or organization:  Not on file    Attends meetings of clubs or organizations: Not on file    Relationship status: Not on file  . Intimate partner violence:    Fear of current or ex partner: Not on file    Emotionally abused: Not on file    Physically abused: Not on file    Forced sexual activity: Not on file  Other Topics Concern  . Not on file  Social History Narrative   Married.   3 children.   Works as a Naval architect   Enjoys hunting    No past surgical history on file.  Family History  Problem Relation Age of Onset  . Heart disease Father   . Hypertension Father   . Hyperlipidemia Father   . Heart disease Sister   . Diabetes Brother   . Hypertension Maternal Grandmother     Allergies  Allergen Reactions  . Hydrochlorothiazide W-Triamterene Nausea Only  . Rosuvastatin Other (See Comments)    Started after 5 years of treatment  . Lipitor [Atorvastatin] Other (See Comments)    Myalgias    Current Outpatient Medications on File Prior to Visit  Medication Sig Dispense Refill  .  amLODipine (NORVASC) 10 MG tablet Take 1 tablet (10 mg total) by mouth daily. For blood pressure. 90 tablet 0  . ASPIRIN 81 PO Take 81 mg by mouth daily.    Marland Kitchen lisinopril (PRINIVIL,ZESTRIL) 40 MG tablet Take 1 tablet (40 mg total) by mouth daily. 90 tablet 3  . metFORMIN (GLUCOPHAGE) 500 MG tablet Take 1 tablet (500 mg total) by mouth 2 (two) times daily with a meal. 60 tablet 0  . PARoxetine (PAXIL) 20 MG tablet Take 1 tablet (20 mg total) by mouth daily. 30 tablet 0  . rosuvastatin (CRESTOR) 10 MG tablet Take 1 tablet by mouth every evening for cholesterol. 30 tablet 0   No current facility-administered medications on file prior to visit.     BP 122/72   Pulse 76   Temp 98 F (36.7 C) (Oral)   Ht 6\' 1"  (1.854 m)   Wt (!) 306 lb 4 oz (138.9 kg)   SpO2 95%   BMI 40.40 kg/m    Objective:   Physical Exam  Constitutional: He appears well-nourished.  Neck: Neck supple.  Cardiovascular: Normal rate and  regular rhythm.  Respiratory: Effort normal and breath sounds normal.  Skin: Skin is warm and dry.           Assessment & Plan:

## 2018-01-24 NOTE — Assessment & Plan Note (Signed)
Improved and at goal on Amlodipine 10 mg, HCTZ 25 mg, and Lisinopril 40 mg. BMP pending. Refills sent to pharmacy.

## 2018-04-07 ENCOUNTER — Ambulatory Visit (INDEPENDENT_AMBULATORY_CARE_PROVIDER_SITE_OTHER): Payer: BLUE CROSS/BLUE SHIELD | Admitting: Primary Care

## 2018-04-07 ENCOUNTER — Other Ambulatory Visit: Payer: Self-pay

## 2018-04-07 VITALS — BP 134/76 | HR 88 | Temp 98.1°F | Ht 73.0 in | Wt 303.5 lb

## 2018-04-07 DIAGNOSIS — R6889 Other general symptoms and signs: Secondary | ICD-10-CM

## 2018-04-07 LAB — POC INFLUENZA A&B (BINAX/QUICKVUE)
INFLUENZA A, POC: NEGATIVE
Influenza B, POC: NEGATIVE

## 2018-04-07 MED ORDER — BENZONATATE 200 MG PO CAPS: 200.0000 mg | ORAL_CAPSULE | Freq: Three times a day (TID) | ORAL | 0 refills | Status: DC | PRN

## 2018-04-07 NOTE — Progress Notes (Signed)
Subjective:    Patient ID: Jared Bradley, male    DOB: 1960-02-09, 58 y.o.   MRN: [id number]  HPI  Jared Bradley is a 58 year old male with a history of hypertension (managed on ACE), diabetes who presents today with a chief complaint of flu like symptoms.  He also reports chills, body aches, cough, nasal congestion. His symptoms began two days ago. He was exposed to influenza A by a co-worker, he drives trucks and his partner was diagnosed three days ago. He's not checked his temperature at all, did have chills/sweats. He's been taking Advil and Tylenol with some improvement.   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 He's not had chills/sweats in 20 hours. Denies exposure to known coronavirus.   Review of Systems  Constitutional: Positive for chills and fatigue.  HENT: Positive for congestion and sore throat. Negative for ear pain and sinus pressure.   Respiratory: Positive for cough. Negative for shortness of breath.        Past Medical History:  Diagnosis Date  . Anxiety   . Hyperlipidemia   . Hypertension      Social History   Socioeconomic History  . Marital status: Married    Spouse name: Not on file  . Number of children: Not on file  . Years of education: Not on file  . Highest education level: Not on file  Occupational History  . Not on file  Social Needs  . Financial resource strain: Not on file  . Food insecurity:    Worry: Not on file    Inability: Not on file  . Transportation needs:    Medical: Not on file    Non-medical: Not on file  Tobacco Use  . Smoking status: Former Games developer  . Smokeless tobacco: Never Used  Substance and Sexual Activity  . Alcohol use: No  . Drug use: No  . Sexual activity: Not on file  Lifestyle  . Physical activity:    Days per week: Not on file    Minutes per session: Not on file  . Stress: Not  on file  Relationships  . Social connections:    Talks on phone: Not on file    Gets together: Not on file    Attends religious service: Not on file    Active member of club or organization: Not on file    Attends meetings of clubs or organizations: Not on file    Relationship status: Not on file  . Intimate partner violence:    Fear of current or ex partner: Not on file    Emotionally abused: Not on file    Physically abused: Not on file    Forced sexual activity: Not on file  Other Topics Concern  . Not on file  Social History Narrative   Married.   3 children.   Works as a Naval architect   Enjoys hunting    No past surgical history on file.  Family History  Problem Relation Age of Onset  . Heart disease Father   . Hypertension Father   . Hyperlipidemia Father   . Heart disease Sister   . Diabetes Brother   . Hypertension Maternal Grandmother     Allergies  Allergen Reactions  . Hydrochlorothiazide W-Triamterene Nausea Only  . Rosuvastatin Other (See Comments)    Started after 5 years of treatment  . Lipitor [Atorvastatin] Other (  See Comments)    Myalgias    Current Outpatient Medications on File Prior to Visit  Medication Sig Dispense Refill  . amLODipine (NORVASC) 10 MG tablet Take 1 tablet (10 mg total) by mouth daily. For blood pressure. 90 tablet 0  . ASPIRIN 81 PO Take 81 mg by mouth daily.    . hydrochlorothiazide (HYDRODIURIL) 25 MG tablet Take 1 tablet (25 mg total) by mouth daily. For blood pressure. 90 tablet 3  . lisinopril (PRINIVIL,ZESTRIL) 40 MG tablet Take 1 tablet (40 mg total) by mouth daily. 90 tablet 3  . metFORMIN (GLUCOPHAGE) 500 MG tablet Take 1 tablet (500 mg total) by mouth 2 (two) times daily with a meal. 60 tablet 0  . PARoxetine (PAXIL) 20 MG tablet Take 1 tablet (20 mg total) by mouth daily. 30 tablet 0  . rosuvastatin (CRESTOR) 10 MG tablet Take 1 tablet by mouth every evening for cholesterol. 30 tablet 0   No current  facility-administered medications on file prior to visit.     BP 134/76   Pulse 88   Temp 98.1 F (36.7 C) (Oral)   Ht 6\' 1"  (1.854 m)   Wt (!) 303 lb 8 oz (137.7 kg)   SpO2 98%   BMI 40.04 kg/m    Objective:   Physical Exam  Constitutional: He appears well-nourished. He does not appear ill.  HENT:  Right Ear: Tympanic membrane and ear canal normal.  Left Ear: Tympanic membrane and ear canal normal.  Nose: No mucosal edema. Right sinus exhibits no maxillary sinus tenderness and no frontal sinus tenderness. Left sinus exhibits no maxillary sinus tenderness and no frontal sinus tenderness.  Mouth/Throat: Oropharynx is clear and moist.  Neck: Neck supple.  Cardiovascular: Normal rate and regular rhythm.  Respiratory: Effort normal and breath sounds normal. He has no wheezes.  Dry cough during exam  Skin: Skin is warm and dry.           Assessment & Plan:  Viral URI:  Flu like symptoms x 2 days, exposed to flu 3 days ago. Rapid flu today negative. Exam today overall unremarkable, he does appear fatigued and has dry cough. Do suspect vital etiology. He has traveled as a Naval architect throughout the [place] and [place] which is of some concern, however, he's had no chills/sweats in over 20 hours and doesn't appear sickly. Discussed coronavirus symptoms and to follow up as appropriate.  Return precautions provided.  Doreene Nest, NP

## 2018-04-07 NOTE — Patient Instructions (Signed)
Most of the time these symptoms are caused by a viral infection which will resolve on its own over time. You should be feeling better by day seven of symptoms.  You can try a few things over the counter to help with your symptoms including:  Cough: Delsym or Robitussin (get the off brand, works just as well) Chest Congestion: Mucinex (plain) Nasal Congestion/Ear Pressure/Sinus Pressure: Try using Flonase (fluticasone) nasal spray. Instill 1 spray in each nostril twice daily. This can be purchased over the counter. Body aches, fevers, headache: Ibuprofen (not to exceed 2400 mg in 24 hours) or Acetaminophen-Tylenol (not to exceed 3000 mg in 24 hours) Runny Nose/Throat Drainage/Sneezing/Itchy or Watery Eyes: An antihistamine such as Zyrtec, Claritin, Xyzal, Allegra  You may take Benzonatate capsules for cough. Take 1 capsule by mouth three times daily as needed for cough.  Please notify me if you develop persistent fevers of 101, develop shortness of breath or increased shortness of breath, and/or feel worse after 1 week of onset of symptoms.   Increase consumption of water intake and rest.  It was a pleasure to see you today!

## 2018-05-13 ENCOUNTER — Other Ambulatory Visit: Payer: Self-pay | Admitting: Primary Care

## 2018-05-13 DIAGNOSIS — E119 Type 2 diabetes mellitus without complications: Secondary | ICD-10-CM

## 2018-05-16 ENCOUNTER — Other Ambulatory Visit: Payer: Self-pay | Admitting: Primary Care

## 2018-05-16 DIAGNOSIS — E119 Type 2 diabetes mellitus without complications: Secondary | ICD-10-CM

## 2018-08-02 ENCOUNTER — Other Ambulatory Visit: Payer: Self-pay | Admitting: Primary Care

## 2018-08-02 DIAGNOSIS — I10 Essential (primary) hypertension: Secondary | ICD-10-CM

## 2018-08-13 ENCOUNTER — Other Ambulatory Visit: Payer: Self-pay | Admitting: Primary Care

## 2018-08-13 DIAGNOSIS — E119 Type 2 diabetes mellitus without complications: Secondary | ICD-10-CM

## 2018-08-20 ENCOUNTER — Telehealth: Payer: Self-pay | Admitting: Primary Care

## 2018-08-20 DIAGNOSIS — E119 Type 2 diabetes mellitus without complications: Secondary | ICD-10-CM

## 2018-08-20 DIAGNOSIS — E785 Hyperlipidemia, unspecified: Secondary | ICD-10-CM

## 2018-08-20 DIAGNOSIS — I10 Essential (primary) hypertension: Secondary | ICD-10-CM

## 2018-08-20 MED ORDER — LISINOPRIL 40 MG PO TABS: 40.0000 mg | ORAL_TABLET | Freq: Every day | ORAL | 1 refills | Status: DC

## 2018-08-20 NOTE — Telephone Encounter (Signed)
Patient returned your call.

## 2018-08-20 NOTE — Telephone Encounter (Signed)
Message left for patient to return my call.  

## 2018-08-20 NOTE — Telephone Encounter (Signed)
Patient schedule CPE and labs in sept. He stated he is needing his medications before that appointment since he will not have enough until then. Patient stated he needs everything but the Metformin and Rosuvastatin.   [facility]

## 2018-08-20 NOTE — Telephone Encounter (Signed)
Spoken to patient that he has plenty of refills of all his BP meds except for [facility]. Patient verbalized understanding.

## 2018-09-07 ENCOUNTER — Other Ambulatory Visit: Payer: Self-pay | Admitting: Primary Care

## 2018-09-07 DIAGNOSIS — E119 Type 2 diabetes mellitus without complications: Secondary | ICD-10-CM

## 2018-09-07 DIAGNOSIS — I10 Essential (primary) hypertension: Secondary | ICD-10-CM

## 2018-09-07 DIAGNOSIS — E785 Hyperlipidemia, unspecified: Secondary | ICD-10-CM

## 2018-09-07 DIAGNOSIS — Z125 Encounter for screening for malignant neoplasm of prostate: Secondary | ICD-10-CM

## 2018-09-30 ENCOUNTER — Telehealth: Payer: Self-pay

## 2018-09-30 DIAGNOSIS — F411 Generalized anxiety disorder: Secondary | ICD-10-CM

## 2018-09-30 MED ORDER — PAROXETINE HCL 20 MG PO TABS: 20.0000 mg | ORAL_TABLET | Freq: Every day | ORAL | 0 refills | Status: DC

## 2018-09-30 NOTE — Telephone Encounter (Signed)
Refill 30 days supply until patient sees Allie Bossier on 10/10/2018.   Patient have been notified.

## 2018-09-30 NOTE — Telephone Encounter (Signed)
[facility] Night - Client TELEPHONE ADVICE RECORD [facility] Patient Name: Jared Bradley Gender: Male DOB: 28-Jun-1960 Age: 58 Y 68 D Return Phone Number: [phone] (Primary) Address: City/State/Zip: [place] [address] Client [facility] Night - Client Client Site [place] Physician Alma Friendly - NP Contact Type Call Who Is Calling Patient / Member / Family / Caregiver Call Type Triage / Clinical Relationship To Patient Self Return Phone Number [phone] (Primary) Chief Complaint Prescription Refill or Medication Request (non symptomatic) Reason for Call Medication Question / Request Initial Comment 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 He is leaving Sunday and only has 1 dose left for Sunday. He has no symptoms. Translation No Nurse Assessment Nurse: [name], RN, Leah Date/Time (Eastern Time): 09/27/2018 3:03:37 PM Confirm and document reason for call. If symptomatic, describe symptoms. 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 He is leaving Sunday and only has 1 dose left for Sunday. He has no symptoms at this time. Prescribing MD Dr. Gentry Fitz, [facility] [address] [facility] [phone]. Pt's next appt is Sept 18. Pt had been receiving rx from a mail-order pharmacy. Pt encouraged to call mail-order pharmacy for possible expedited loaner dose, or to call office Tuesday AM for new [facility]. Pt encouraged to call back or go to UC if withdrawal sx develop. Has the patient had close contact with a person known or suspected to have the novel coronavirus illness OR traveled / lives in area with major community spread (including international travel) in the last 14 days from the onset of symptoms? * If Asymptomatic, screen for exposure and  travel within the last 14 days. ---No Does the patient have any new or worsening symptoms? ---No Guidelines Guideline Title Affirmed Question Affirmed Notes Nurse Date/Time (Eastern Time) Disp. Time Eilene Ghazi Time) Disposition Final User 09/27/2018 2:51:01 PM Send To RN Personal [name], RN, Haynes Dage 09/27/2018 3:17:31 PM Clinical Call Yes [name], RN, Denny Peon

## 2018-10-03 ENCOUNTER — Other Ambulatory Visit: Payer: BLUE CROSS/BLUE SHIELD

## 2018-10-03 ENCOUNTER — Telehealth: Payer: Self-pay

## 2018-10-03 NOTE — Telephone Encounter (Signed)
Left detailed VM w COVID screen and back door lab info and front door info   

## 2018-10-07 ENCOUNTER — Other Ambulatory Visit: Payer: BC Managed Care – PPO

## 2018-10-08 ENCOUNTER — Other Ambulatory Visit: Payer: BC Managed Care – PPO

## 2018-10-10 ENCOUNTER — Encounter: Payer: BLUE CROSS/BLUE SHIELD | Admitting: Primary Care

## 2018-10-17 ENCOUNTER — Ambulatory Visit (INDEPENDENT_AMBULATORY_CARE_PROVIDER_SITE_OTHER): Payer: BC Managed Care – PPO | Admitting: Primary Care

## 2018-10-17 ENCOUNTER — Other Ambulatory Visit: Payer: Self-pay

## 2018-10-17 ENCOUNTER — Encounter: Payer: Self-pay | Admitting: Primary Care

## 2018-10-17 VITALS — BP 120/82 | HR 88 | Temp 98.6°F | Ht 73.0 in | Wt 310.2 lb

## 2018-10-17 DIAGNOSIS — E785 Hyperlipidemia, unspecified: Secondary | ICD-10-CM | POA: Diagnosis not present

## 2018-10-17 DIAGNOSIS — Z23 Encounter for immunization: Secondary | ICD-10-CM

## 2018-10-17 DIAGNOSIS — Z0000 Encounter for general adult medical examination without abnormal findings: Secondary | ICD-10-CM | POA: Diagnosis not present

## 2018-10-17 DIAGNOSIS — E119 Type 2 diabetes mellitus without complications: Secondary | ICD-10-CM

## 2018-10-17 DIAGNOSIS — Z125 Encounter for screening for malignant neoplasm of prostate: Secondary | ICD-10-CM | POA: Diagnosis not present

## 2018-10-17 DIAGNOSIS — N529 Male erectile dysfunction, unspecified: Secondary | ICD-10-CM

## 2018-10-17 DIAGNOSIS — F411 Generalized anxiety disorder: Secondary | ICD-10-CM

## 2018-10-17 DIAGNOSIS — I10 Essential (primary) hypertension: Secondary | ICD-10-CM

## 2018-10-17 LAB — HEMOGLOBIN A1C: Hgb A1c MFr Bld: 7.1 % — ABNORMAL HIGH (ref 4.6–6.5)

## 2018-10-17 LAB — LIPID PANEL
Cholesterol: 184 mg/dL (ref 0–200)
HDL: 38.5 mg/dL — ABNORMAL LOW (ref >39.00)
LDL Cholesterol: 116 mg/dL — ABNORMAL HIGH (ref 0–99)
NonHDL: 145.65
Total CHOL/HDL Ratio: 5
Triglycerides: 148 mg/dL (ref 0.0–149.0)
VLDL: 29.6 mg/dL (ref 0.0–40.0)

## 2018-10-17 LAB — CBC
HCT: 42.1 % (ref 39.0–52.0)
Hemoglobin: 14.3 g/dL (ref 13.0–17.0)
MCHC: 34.1 g/dL (ref 30.0–36.0)
MCV: 91 fl (ref 78.0–100.0)
Platelets: 314 K/uL (ref 150.0–400.0)
RBC: 4.63 Mil/uL (ref 4.22–5.81)
RDW: 13.2 % (ref 11.5–15.5)
WBC: 6 K/uL (ref 4.0–10.5)

## 2018-10-17 LAB — COMPREHENSIVE METABOLIC PANEL
ALT: 47 U/L (ref 0–53)
AST: 42 U/L — ABNORMAL HIGH (ref 0–37)
Albumin: 4.4 g/dL (ref 3.5–5.2)
Alkaline Phosphatase: 72 U/L (ref 39–117)
BUN: 15 mg/dL (ref 6–23)
CO2: 29 mEq/L (ref 19–32)
Calcium: 10 mg/dL (ref 8.4–10.5)
Chloride: 100 mEq/L (ref 96–112)
Creatinine, Ser: 0.88 mg/dL (ref 0.40–1.50)
GFR: 89.55 mL/min (ref >60.00)
Glucose, Bld: 112 mg/dL — ABNORMAL HIGH (ref 70–99)
Potassium: 4.3 mEq/L (ref 3.5–5.1)
Sodium: 136 mEq/L (ref 135–145)
Total Bilirubin: 0.4 mg/dL (ref 0.2–1.2)
Total Protein: 6.9 g/dL (ref 6.0–8.3)

## 2018-10-17 LAB — PSA: PSA: 0.73 ng/mL (ref 0.10–4.00)

## 2018-10-17 MED ORDER — HYDROCHLOROTHIAZIDE 25 MG PO TABS: 25.0000 mg | ORAL_TABLET | Freq: Every day | ORAL | 3 refills | Status: DC

## 2018-10-17 MED ORDER — PAROXETINE HCL 20 MG PO TABS: 20.0000 mg | ORAL_TABLET | Freq: Every day | ORAL | 3 refills | Status: DC

## 2018-10-17 MED ORDER — ROSUVASTATIN CALCIUM 10 MG PO TABS: ORAL_TABLET | ORAL | 3 refills | Status: DC

## 2018-10-17 MED ORDER — LISINOPRIL 40 MG PO TABS: 40.0000 mg | ORAL_TABLET | Freq: Every day | ORAL | 3 refills | Status: DC

## 2018-10-17 MED ORDER — SILDENAFIL CITRATE 20 MG PO TABS: ORAL_TABLET | ORAL | 0 refills | Status: DC

## 2018-10-17 MED ORDER — AMLODIPINE BESYLATE 10 MG PO TABS: ORAL_TABLET | ORAL | 3 refills | Status: DC

## 2018-10-17 NOTE — Patient Instructions (Addendum)
Stop by the lab prior to leaving today. I will notify you of your results once received.   Start exercising. You should be getting 150 minutes of moderate intensity exercise weekly.  It is important that you improve your diet. Please limit carbohydrates in the form of white bread, rice, pasta, sweets, fast food, fried food, sugary drinks, etc. Increase your consumption of fresh fruits and vegetables, whole grains, lean protein.  Ensure you are consuming 64 ounces of water daily.  Try sildenafil for erectile dysfunction. Start with 2 tablets 30 minutes prior to intercourse, you can take a maximum of 5 tablets.   Please schedule a follow up appointment in 6 months for a diabetes check.  It was a pleasure to see you today!   Preventive Care 57-41 Years Old, Male Preventive care refers to lifestyle choices and visits with your health care provider that can promote health and wellness. This includes:  A yearly physical exam. This is also called an annual well check.  Regular dental and eye exams.  Immunizations.  Screening for certain conditions.  Healthy lifestyle choices, such as eating a healthy diet, getting regular exercise, not using drugs or products that contain nicotine and tobacco, and limiting alcohol use. What can I expect for my preventive care visit? Physical exam Your health care provider will check:  Height and weight. These may be used to calculate body mass index (BMI), which is a measurement that tells if you are at a healthy weight.  Heart rate and blood pressure.  Your skin for abnormal spots. Counseling Your health care provider may ask you questions about:  Alcohol, tobacco, and drug use.  Emotional well-being.  Home and relationship well-being.  Sexual activity.  Eating habits.  Work and work Statistician. What immunizations do I need?  Influenza (flu) vaccine  This is recommended every year. Tetanus, diphtheria, and pertussis (Tdap) vaccine   You may need a Td booster every 10 years. Varicella (chickenpox) vaccine  You may need this vaccine if you have not already been vaccinated. Zoster (shingles) vaccine  You may need this after age 27. Measles, mumps, and rubella (MMR) vaccine  You may need at least one dose of MMR if you were born in 1957 or later. You may also need a second dose. Pneumococcal conjugate (PCV13) vaccine  You may need this if you have certain conditions and were not previously vaccinated. Pneumococcal polysaccharide (PPSV23) vaccine  You may need one or two doses if you smoke cigarettes or if you have certain conditions. Meningococcal conjugate (MenACWY) vaccine  You may need this if you have certain conditions. Hepatitis A vaccine  You may need this if you have certain conditions or if you travel or work in places where you may be exposed to hepatitis A. Hepatitis B vaccine  You may need this if you have certain conditions or if you travel or work in places where you may be exposed to hepatitis B. Haemophilus influenzae type b (Hib) vaccine  You may need this if you have certain risk factors. Human papillomavirus (HPV) vaccine  If recommended by your health care provider, you may need three doses over 6 months. You may receive vaccines as individual doses or as more than one vaccine together in one shot (combination vaccines). Talk with your health care provider about the risks and benefits of combination vaccines. What tests do I need? Blood tests  Lipid and cholesterol levels. These may be checked every 5 years, or more frequently if you are  over 68 years old.  Hepatitis C test.  Hepatitis B test. Screening  Lung cancer screening. You may have this screening every year starting at age 1 if you have a 30-pack-year history of smoking and currently smoke or have quit within the past 15 years.  Prostate cancer screening. Recommendations will vary depending on your family history and other  risks.  Colorectal cancer screening. All adults should have this screening starting at age 6 and continuing until age 1. Your health care provider may recommend screening at age 20 if you are at increased risk. You will have tests every 1-10 years, depending on your results and the type of screening test.  Diabetes screening. This is done by checking your blood sugar (glucose) after you have not eaten for a while (fasting). You may have this done every 1-3 years.  Sexually transmitted disease (STD) testing. Follow these instructions at home: Eating and drinking  Eat a diet that includes fresh fruits and vegetables, whole grains, lean protein, and low-fat dairy products.  Take vitamin and mineral supplements as recommended by your health care provider.  Do not drink alcohol if your health care provider tells you not to drink.  If you drink alcohol: ? Limit how much you have to 0-2 drinks a day. ? Be aware of how much alcohol is in your drink. In the [place] one drink equals one 12 oz bottle of beer (355 mL), one 5 oz glass of wine (148 mL), or one 1 oz glass of hard liquor (44 mL). Lifestyle  Take daily care of your teeth and gums.  Stay active. Exercise for at least 30 minutes on 5 or more days each week.  Do not use any products that contain nicotine or tobacco, such as cigarettes, e-cigarettes, and chewing tobacco. If you need help quitting, ask your health care provider.  If you are sexually active, practice safe sex. Use a condom or other form of protection to prevent STIs (sexually transmitted infections).  Talk with your health care provider about taking a low-dose aspirin every day starting at age 91. What's next?  Go to your health care provider once a year for a well check visit.  Ask your health care provider how often you should have your eyes and teeth checked.  Stay up to date on all vaccines. This information is not intended to replace advice given to you by your  health care provider. Make sure you discuss any questions you have with your health care provider. Document Released: 02/04/2015 Document Revised: 01/02/2018 Document Reviewed: 01/02/2018 Elsevier Patient Education  2020 [employer].

## 2018-10-17 NOTE — Assessment & Plan Note (Signed)
Doing well on Paxil, denies SI/HI. Continue current regimen.

## 2018-10-17 NOTE — Assessment & Plan Note (Signed)
A1C pending, compliant to Metformin. Managed on statin and ACE. Pneumonia vaccination UTD. Eye exam scheduled for October 2020. Foot exam today.  Discussed the importance of a healthy diet and regular exercise in order for weight loss, and to reduce the risk of any potential medical problems.  Follow up in 6 months.

## 2018-10-17 NOTE — Assessment & Plan Note (Signed)
Stable in the office today, continue current regimen. BMP pending. 

## 2018-10-17 NOTE — Addendum Note (Signed)
Addended by: Pleas Koch on: 10/17/2018 10:54 AM   Modules accepted: Orders

## 2018-10-17 NOTE — Addendum Note (Signed)
Addended by: Jacqualin Combes on: 10/17/2018 11:05 AM   Modules accepted: Orders

## 2018-10-17 NOTE — Assessment & Plan Note (Signed)
Likely secondary to a combination of morbid obesity with co-morbidities and medication. Discussed the importance of weight loss through diet and exercise.  Rx for sildenafil 20 mg sent to pharmacy. Instructions provided for use. He will update.

## 2018-10-17 NOTE — Progress Notes (Addendum)
Subjective:    Patient ID: Jared Bradley, male    DOB: August 09, 1960, 58 y.o.   MRN: [id number]  HPI  Jared Bradley is a 58 year old male who presents today for complete physical.  He would also like treatment for erectile dysfunction. History of morbid obesity, hypertension, diabetes. Difficulty maintaining an erection for numerous months, ready to try medication.   Immunizations: -Tetanus: Completed in 2015 -Influenza: Due today -Pneumonia: Completed last in 2019  Diet: He endorses a poor diet. Mostly fast food and snacks. Drinking mostly water, coffee, and soda. Exercise: He is not exercising.  Eye exam: Scheduled for October 2020 Dental exam: Completes semi-annually  Colonoscopy: Never completed, declines PSA: Due Hep C Screen: Negative in 2018  BP Readings from Last 3 Encounters:  10/17/18 120/82  04/07/18 134/76  01/24/18 122/72     Review of Systems  Constitutional: Negative for unexpected weight change.  HENT: Negative for rhinorrhea.   Respiratory: Negative for cough and shortness of breath.   Cardiovascular: Negative for chest pain.  Gastrointestinal: Negative for constipation and diarrhea.  Genitourinary: Negative for difficulty urinating.  Musculoskeletal: Positive for arthralgias.  Skin: Negative for rash.  Allergic/Immunologic: Negative for environmental allergies.  Neurological: Negative for dizziness, numbness and headaches.  Psychiatric/Behavioral:       Doing well on paroxetine, denies SI/HI       Past Medical History:  Diagnosis Date  . Anxiety   . Hyperlipidemia   . Hypertension      Social History   Socioeconomic History  . Marital status: Married    Spouse name: Not on file  . Number of children: Not on file  . Years of education: Not on file  . Highest education level: Not on file  Occupational History  . Not on file  Social Needs  . Financial resource strain: Not on file  . Food insecurity    Worry: Not on file   Inability: Not on file  . Transportation needs    Medical: Not on file    Non-medical: Not on file  Tobacco Use  . Smoking status: Former Games developer  . Smokeless tobacco: Never Used  Substance and Sexual Activity  . Alcohol use: No  . Drug use: No  . Sexual activity: Not on file  Lifestyle  . Physical activity    Days per week: Not on file    Minutes per session: Not on file  . Stress: Not on file  Relationships  . Social Musician on phone: Not on file    Gets together: Not on file    Attends religious service: Not on file    Active member of club or organization: Not on file    Attends meetings of clubs or organizations: Not on file    Relationship status: Not on file  . Intimate partner violence    Fear of current or ex partner: Not on file    Emotionally abused: Not on file    Physically abused: Not on file    Forced sexual activity: Not on file  Other Topics Concern  . Not on file  Social History Narrative   Married.   3 children.   Works as a Naval architect   Enjoys hunting    No past surgical history on file.  Family History  Problem Relation Age of Onset  . Heart disease Father   . Hypertension Father   . Hyperlipidemia Father   . Heart disease Sister   .  Diabetes Brother   . Hypertension Maternal Grandmother     Allergies  Allergen Reactions  . Hydrochlorothiazide W-Triamterene Nausea Only  . Rosuvastatin Other (See Comments)    Started after 5 years of treatment  . Lipitor [Atorvastatin] Other (See Comments)    Myalgias    Current Outpatient Medications on File Prior to Visit  Medication Sig Dispense Refill  . ASPIRIN 81 PO Take 81 mg by mouth daily.    . metFORMIN (GLUCOPHAGE) 500 MG tablet TAKE 1 TABLET (500 MG TOTAL) BY MOUTH 2 (TWO) TIMES DAILY WITH A MEAL. 180 tablet 0   No current facility-administered medications on file prior to visit.     BP 120/82   Pulse 88   Temp 98.6 F (37 C) (Temporal)   Ht 6\' 1"  (1.854 m)   Wt (!)  310 lb 4 oz (140.7 kg)   SpO2 98%   BMI 40.93 kg/m    Objective:   Physical Exam  Constitutional: He is oriented to person, place, and time. He appears well-nourished.  HENT:  Right Ear: Tympanic membrane and ear canal normal.  Left Ear: Tympanic membrane and ear canal normal.  Mouth/Throat: Oropharynx is clear and moist.  Eyes: Pupils are equal, round, and reactive to light. EOM are normal.  Neck: Neck supple.  Cardiovascular: Normal rate and regular rhythm.  Respiratory: Effort normal and breath sounds normal.  GI: Soft. Bowel sounds are normal. There is no abdominal tenderness.  Musculoskeletal: Normal range of motion.  Neurological: He is alert and oriented to person, place, and time.  Skin: Skin is warm and dry.  Psychiatric: He has a normal mood and affect.           Assessment & Plan:

## 2018-10-17 NOTE — Assessment & Plan Note (Signed)
Repeat lipid panel pending. Continue rosuvastatin.

## 2018-10-17 NOTE — Assessment & Plan Note (Signed)
Immunizations UTD. Colon cancer screening overdue, he declines colonoscopy, Cologuard, and IFOB cards despite recommendations.  PSA pending. Discussed the importance of a healthy diet and regular exercise in order for weight loss, and to reduce the risk of any potential medical problems. Exam today stable. Labs pending.

## 2018-10-24 LAB — HM DIABETES EYE EXAM

## 2018-10-29 ENCOUNTER — Telehealth: Payer: Self-pay | Admitting: Primary Care

## 2018-10-29 DIAGNOSIS — N529 Male erectile dysfunction, unspecified: Secondary | ICD-10-CM

## 2018-10-29 NOTE — Telephone Encounter (Signed)
Patient called and said the medication she prescribed at his last office visit needs a prior authorization.  [facility] said [facility] haven't heard from [place].

## 2018-10-30 ENCOUNTER — Encounter: Payer: Self-pay | Admitting: Primary Care

## 2018-10-31 MED ORDER — SILDENAFIL CITRATE 20 MG PO TABS: ORAL_TABLET | ORAL | 0 refills | Status: DC

## 2018-10-31 NOTE — Telephone Encounter (Signed)
Spoken to patient and make him aware that most insurance will not pay for ED medication. That is why [facility] send sildenafil at 20 mg dosage.  Inform patient that we can send this to [facility] and with a Good Rx card, he can get this for $15.  Patient stated that to go ahead and sent it to [facility].

## 2018-10-31 NOTE — Telephone Encounter (Signed)
Patient called back in regards to PA he is needing for the medication. He is requesting a call back to see if this has been submitted

## 2018-11-05 ENCOUNTER — Telehealth: Payer: Self-pay | Admitting: Primary Care

## 2018-11-05 DIAGNOSIS — G4730 Sleep apnea, unspecified: Secondary | ICD-10-CM

## 2018-11-05 NOTE — Telephone Encounter (Signed)
When was his last sleep study? How old is his machine?  I will place a referral to pulmonology for evaluation and management.

## 2018-11-05 NOTE — Telephone Encounter (Signed)
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  Patient is not sure if you are able to write the prescription or if he is needing to be seen by a specialist so they can.     Patient is requesting a call back.

## 2018-11-05 NOTE — Telephone Encounter (Signed)
Message left for patient to return my call.  

## 2018-11-06 NOTE — Telephone Encounter (Signed)
Last sleep study may 2015 Get got his machine 2015  Pt aware of kate's comments  Pt has one at home and wanted to take one with him for work  He drives truck

## 2018-11-07 DIAGNOSIS — G4730 Sleep apnea, unspecified: Secondary | ICD-10-CM | POA: Insufficient documentation

## 2018-11-07 NOTE — Assessment & Plan Note (Signed)
Compliant to CPAP, referral placed to pulmonology for new machine/supplies.

## 2018-11-07 NOTE — Telephone Encounter (Signed)
Noted, referral placed to pulmonology.

## 2018-11-07 NOTE — Addendum Note (Signed)
Addended by: Pleas Koch on: 11/07/2018 10:18 AM   Modules accepted: Orders

## 2018-11-16 ENCOUNTER — Other Ambulatory Visit: Payer: Self-pay | Admitting: Primary Care

## 2018-11-16 DIAGNOSIS — E119 Type 2 diabetes mellitus without complications: Secondary | ICD-10-CM

## 2018-11-17 NOTE — Telephone Encounter (Signed)
Pt called wanting a call back from [employer] number [phone]

## 2018-11-17 NOTE — Telephone Encounter (Signed)
Spoken to patient and inform someone will be in contact regarding the referral.

## 2019-01-12 ENCOUNTER — Telehealth: Payer: Self-pay | Admitting: Pulmonary Disease

## 2019-01-12 NOTE — Telephone Encounter (Signed)
We need to offer phone or virtual.

## 2019-01-13 ENCOUNTER — Ambulatory Visit (INDEPENDENT_AMBULATORY_CARE_PROVIDER_SITE_OTHER): Payer: BC Managed Care – PPO | Admitting: Pulmonary Disease

## 2019-01-13 ENCOUNTER — Telehealth: Payer: Self-pay | Admitting: Pulmonary Disease

## 2019-01-13 ENCOUNTER — Encounter: Payer: Self-pay | Admitting: Pulmonary Disease

## 2019-01-13 ENCOUNTER — Other Ambulatory Visit: Payer: Self-pay

## 2019-01-13 VITALS — BP 130/82 | HR 88 | Temp 98.4°F | Ht 73.0 in | Wt 310.0 lb

## 2019-01-13 DIAGNOSIS — Z9989 Dependence on other enabling machines and devices: Secondary | ICD-10-CM | POA: Diagnosis not present

## 2019-01-13 DIAGNOSIS — G4733 Obstructive sleep apnea (adult) (pediatric): Secondary | ICD-10-CM | POA: Diagnosis not present

## 2019-01-13 NOTE — Progress Notes (Signed)
Pulmonary, Critical Care, and Sleep Medicine  Chief Complaint  Patient presents with  . Consult    Patient is here for CPAP. 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 Patient feels good overall    Constitutional:  BP 130/82 (BP Location: Right Arm, Patient Position: Sitting, Cuff Size: Large)   Pulse 88   Temp 98.4 F (36.9 C) (Temporal)   Ht 6\' 1"  (1.854 m)   Wt (!) 310 lb (140.6 kg)   SpO2 96% Comment: on RA  BMI 40.90 kg/m   Past Medical History:  Anxiety, HLD, HTN  Brief Summary:  Jared Bradley is a 58 y.o. male with obstructive sleep apnea.  He is a Proofreader.  He had his sleep study done with [facility] in May 2015.  Results not available for review.  He was started on CPAP.  He uses this every night.  He uses nasal pillows.  He works as a Administrator.  Can't always bring his CPAP with him when he is driving.  Has more trouble sleeping w/o CPAP.  He drives two days per week.  He goes to sleep at 10 pm.  He falls asleep in 10 minutes.  He wakes up some times to use the bathroom.  He gets out of bed at 8 am.  He feels okay in the morning.  He denies morning headache.  He does not use anything to help him fall sleep or stay awake.  He denies sleep walking, sleep talking, bruxism, or nightmares.  There is no history of restless legs.  He denies sleep hallucinations, sleep paralysis, or cataplexy.  The Epworth score is 2 out of 24.     Physical Exam:   Appearance - well kempt   ENMT - clear nasal mucosa, midline nasal  septum, no oral exudates, no LAN, trachea midline  Respiratory - normal chest wall, normal respiratory effort, no accessory muscle use, no wheeze/rales  CV - s1s2 regular rate and rhythm, no murmurs, no peripheral edema, radial pulses symmetric  GI - soft, non tender, no masses  Lymph - no adenopathy noted in neck and axillary areas  MSK - normal gait  Ext - no cyanosis, clubbing, or joint inflammation  noted  Skin - no rashes, lesions, or ulcers  Neuro - normal strength, oriented x 3  Psych - normal mood and affect  Discussion:  He has history of obstructive sleep apnea and has been compliant with CPAP.  He is a Proofreader.  He would like to get a new machine, and this would allow him to bring his machine with him when he is driving.  Assessment/Plan:   Obstructive sleep apnea. - he reports compliance with CPAP - his current machine is more than 56 yrs old, not function properly, and not amenable to repair - will arrange for new auto CPAP  - explained he might need repeat home sleep study for insurance coverage prior to getting new CPAP set up - will try to get copy of his prior sleep study from [place] from May 2015  Obesity. - discussed importance of weight loss    Patient Instructions  Will arrange for new auto CPAP machine  Will have you sign a release form to get copy of your sleep study from [place] done in May 2015  Follow up in 3 months    Chesley Mires, MD [place] Pager: [phone] 01/13/2019, 9:59 AM  Flow Sheet  Sleep tests:   Review of Systems:  Anxiety, ankle swelling.  Remainder reviewed and negative.  Medications:   Allergies as of 01/13/2019      Reactions   Hydrochlorothiazide W-triamterene Nausea Only   Rosuvastatin Other (See Comments)   Started after 5 years of treatment   Lipitor [atorvastatin] Other (See Comments)   Myalgias      Medication List       Accurate as of January 13, 2019  9:59 AM. If you have any questions, ask your nurse or doctor.        amLODipine 10 MG tablet Commonly known as: NORVASC TAKE 1 TABLET BY MOUTH EVERY DAY FOR BLOOD PRESSURE   ASPIRIN 81 PO Take 81 mg by mouth daily.   hydrochlorothiazide 25 MG tablet Commonly known as: HYDRODIURIL Take 1 tablet (25 mg total) by mouth daily. For blood pressure.   lisinopril 40 MG tablet Commonly known  as: ZESTRIL Take 1 tablet (40 mg total) by mouth daily. For blood pressure   metFORMIN 500 MG tablet Commonly known as: GLUCOPHAGE TAKE 1 TABLET (500 MG TOTAL) BY MOUTH 2 (TWO) TIMES DAILY WITH A MEAL.   PARoxetine 20 MG tablet Commonly known as: PAXIL Take 1 tablet (20 mg total) by mouth daily.   rosuvastatin 10 MG tablet Commonly known as: CRESTOR Take 1 tablet by mouth every evening for cholesterol.   sildenafil 20 MG tablet Commonly known as: REVATIO Take 2-5 tablets by mouth 30 minutes prior to intercourse.       Past Surgical History:  He has not had any prior surgeries.  Family History:  His family history includes Diabetes in his brother; Heart disease in his father and sister; Hyperlipidemia in his father; Hypertension in his father and maternal grandmother.  Social History:  He  reports that he has quit smoking. He has never used smokeless tobacco. He reports that he does not drink alcohol or use drugs.

## 2019-01-13 NOTE — Telephone Encounter (Signed)
Spoke to [place] with [facility] and requested that sleep study be faxed to our office.

## 2019-01-13 NOTE — Telephone Encounter (Signed)
Called and spoke to pt, who stated that sleep study was preformed with [facility].

## 2019-01-13 NOTE — Patient Instructions (Addendum)
Will arrange for new auto CPAP machine  Will have you sign a release form to get copy of your sleep study from [place] done in May 2015  Follow up in 3 months

## 2019-01-13 NOTE — Telephone Encounter (Signed)
Sleep study has been received and fax to [place] for Dr. Halford Chessman to review.  Will route to Dr. Halford Chessman to make aware.

## 2019-01-14 NOTE — Telephone Encounter (Signed)
Split night study 423/15 >> AHI 54, SpO2 low 75.8%.  Will have study scanned into Epic.

## 2019-02-25 ENCOUNTER — Telehealth: Payer: Self-pay | Admitting: Pulmonary Disease

## 2019-02-25 DIAGNOSIS — G4733 Obstructive sleep apnea (adult) (pediatric): Secondary | ICD-10-CM

## 2019-02-26 NOTE — Telephone Encounter (Signed)
LVMTCB x 1

## 2019-02-26 NOTE — Telephone Encounter (Signed)
Pt stated [facility] wanted $1200 for for a new machine and he was able to find one for less than half the price. Pt will come by and pick up printed rx. Nothing further needed.

## 2019-02-26 NOTE — Telephone Encounter (Signed)
Pt returning a phone call.. [phone]

## 2019-04-17 ENCOUNTER — Ambulatory Visit (INDEPENDENT_AMBULATORY_CARE_PROVIDER_SITE_OTHER): Payer: BC Managed Care – PPO | Admitting: Primary Care

## 2019-04-17 ENCOUNTER — Other Ambulatory Visit: Payer: Self-pay

## 2019-04-17 ENCOUNTER — Encounter: Payer: Self-pay | Admitting: Primary Care

## 2019-04-17 VITALS — BP 130/84 | HR 86 | Temp 97.3°F | Ht 73.0 in | Wt 306.2 lb

## 2019-04-17 DIAGNOSIS — E119 Type 2 diabetes mellitus without complications: Secondary | ICD-10-CM | POA: Diagnosis not present

## 2019-04-17 LAB — POCT GLYCOSYLATED HEMOGLOBIN (HGB A1C): Hemoglobin A1C: 6.7 % — AB (ref 4.0–5.6)

## 2019-04-17 MED ORDER — METFORMIN HCL ER 500 MG PO TB24: 500.0000 mg | ORAL_TABLET | Freq: Every day | ORAL | 3 refills | Status: DC

## 2019-04-17 NOTE — Progress Notes (Signed)
Subjective:    Patient ID: Jared Bradley, male    DOB: 1960-10-12, 59 y.o.   MRN: [id number]  HPI  This visit occurred during the SARS-CoV-2 public health emergency.  Safety protocols were in place, including screening questions prior to the visit, additional usage of staff PPE, and extensive cleaning of exam room while observing appropriate contact time as indicated for disinfecting solutions.   Mr. Jared Bradley is a 59 year old male with a history of sleep apnea, hypertension, type 2 diabetes, hyperlipidemia who presents today for follow up of diabetes.  Current medications include: Metformin 500 mg BID. He forgets one dose of his metformin about twice weekly.  He is checking his blood glucose 0 times daily.   Last A1C: 7.1 in September 2020, 6.7 today Last Eye Exam: Completed in October 2020 Last Foot Exam: Due in September 2021 Pneumonia Vaccination: Completed in 2019 ACE/ARB: lisinopril  Statin: rosuvastatin   BP Readings from Last 3 Encounters:  04/17/19 130/84  01/13/19 130/82  10/17/18 120/82     Wt Readings from Last 3 Encounters:  04/17/19 (!) 306 lb 4 oz (138.9 kg)  01/13/19 (!) 310 lb (140.6 kg)  10/17/18 (!) 310 lb 4 oz (140.7 kg)    He denies chest pain, dizziness, numbness/tingling to feet, visual changes.   Review of Systems  Eyes: Negative for visual disturbance.  Respiratory: Negative for shortness of breath.   Cardiovascular: Negative for chest pain.  Neurological: Negative for dizziness and headaches.       Past Medical History:  Diagnosis Date  . Anxiety   . Hyperlipidemia   . Hypertension      Social History   Socioeconomic History  . Marital status: Married    Spouse name: Not on file  . Number of children: Not on file  . Years of education: Not on file  . Highest education level: Not on file  Occupational History  . Not on file  Tobacco Use  . Smoking status: Former Research scientist (life sciences)  . Smokeless tobacco: Never Used  Substance and  Sexual Activity  . Alcohol use: No  . Drug use: No  . Sexual activity: Not on file  Other Topics Concern  . Not on file  Social History Narrative   Married.   3 children.   Works as a Administrator   Enjoys IT sales professional Strain:   . Difficulty of Paying Living Expenses:   Food Insecurity:   . Worried About Charity fundraiser in the Last Year:   . Arboriculturist in the Last Year:   Transportation Needs:   . Film/video editor (Medical):   Marland Kitchen Lack of Transportation (Non-Medical):   Physical Activity:   . Days of Exercise per Week:   . Minutes of Exercise per Session:   Stress:   . Feeling of Stress :   Social Connections:   . Frequency of Communication with Friends and Family:   . Frequency of Social Gatherings with Friends and Family:   . Attends Religious Services:   . Active Member of Clubs or Organizations:   . Attends Archivist Meetings:   Marland Kitchen Marital Status:   Intimate Partner Violence:   . Fear of Current or Ex-Partner:   . Emotionally Abused:   Marland Kitchen Physically Abused:   . Sexually Abused:     No past surgical history on file.  Family History  Problem Relation Age of Onset  .  Heart disease Father   . Hypertension Father   . Hyperlipidemia Father   . Heart disease Sister   . Diabetes Brother   . Hypertension Maternal Grandmother     Allergies  Allergen Reactions  . Hydrochlorothiazide W-Triamterene Nausea Only  . Rosuvastatin Other (See Comments)    Started after 5 years of treatment  . Lipitor [Atorvastatin] Other (See Comments)    Myalgias    Current Outpatient Medications on File Prior to Visit  Medication Sig Dispense Refill  . amLODipine (NORVASC) 10 MG tablet TAKE 1 TABLET BY MOUTH EVERY DAY FOR BLOOD PRESSURE 90 tablet 3  . ASPIRIN 81 PO Take 81 mg by mouth daily.    . hydrochlorothiazide (HYDRODIURIL) 25 MG tablet Take 1 tablet (25 mg total) by mouth daily. For blood pressure. 90  tablet 3  . lisinopril (ZESTRIL) 40 MG tablet Take 1 tablet (40 mg total) by mouth daily. For blood pressure 90 tablet 3  . PARoxetine (PAXIL) 20 MG tablet Take 1 tablet (20 mg total) by mouth daily. 90 tablet 3  . rosuvastatin (CRESTOR) 10 MG tablet Take 1 tablet by mouth every evening for cholesterol. 90 tablet 3  . sildenafil (REVATIO) 20 MG tablet Take 2-5 tablets by mouth 30 minutes prior to intercourse. 50 tablet 0   No current facility-administered medications on file prior to visit.    BP 130/84   Pulse 86   Temp (!) 97.3 F (36.3 C) (Temporal)   Ht 6\' 1"  (1.854 m)   Wt (!) 306 lb 4 oz (138.9 kg)   SpO2 98%   BMI 40.40 kg/m    Objective:   Physical Exam  Constitutional: He appears well-nourished.  Cardiovascular: Normal rate and regular rhythm.  Respiratory: Effort normal and breath sounds normal.  Musculoskeletal:     Cervical back: Neck supple.  Skin: Skin is warm and dry.           Assessment & Plan:

## 2019-04-17 NOTE — Patient Instructions (Signed)
Start walking. You should be getting 150 minutes of exercise weekly.  You can start the metformin XR 500 mg once daily for diabetes once you finish your old bottle.  It is important that you improve your diet. Please limit carbohydrates in the form of white bread, rice, pasta, sweets, fast food, fried food, sugary drinks, etc. Increase your consumption of fresh fruits and vegetables, whole grains, lean protein.  Ensure you are consuming 64 ounces of water daily.  Please schedule a physical with me in 6 months. You may also schedule a lab only appointment 3-4 days prior. We will discuss your lab results in detail during your physical.  It was a pleasure to see you today!

## 2019-04-17 NOTE — Assessment & Plan Note (Addendum)
A1C today of 6.7 which is an improvement from September 2020. Encouraged him to continue to walk, increase frequency.   Continue Metformin, change to XR version as he often forgets his second dose.   Managed on statin and ACE. Pneumonia vaccination UTD. Foot exam UTD. Eye exam UTD.  Follow up in 6 months

## 2019-05-13 ENCOUNTER — Other Ambulatory Visit: Payer: Self-pay

## 2019-05-13 ENCOUNTER — Ambulatory Visit (INDEPENDENT_AMBULATORY_CARE_PROVIDER_SITE_OTHER): Payer: BC Managed Care – PPO | Admitting: Internal Medicine

## 2019-05-13 ENCOUNTER — Encounter: Payer: Self-pay | Admitting: Internal Medicine

## 2019-05-13 DIAGNOSIS — L03119 Cellulitis of unspecified part of limb: Secondary | ICD-10-CM | POA: Insufficient documentation

## 2019-05-13 DIAGNOSIS — L03116 Cellulitis of left lower limb: Secondary | ICD-10-CM

## 2019-05-13 DIAGNOSIS — L0390 Cellulitis, unspecified: Secondary | ICD-10-CM | POA: Insufficient documentation

## 2019-05-13 HISTORY — DX: Cellulitis of unspecified part of limb: L03.119

## 2019-05-13 MED ORDER — DOXYCYCLINE HYCLATE 100 MG PO TABS: 100.0000 mg | ORAL_TABLET | Freq: Two times a day (BID) | ORAL | 1 refills | Status: DC

## 2019-05-13 NOTE — Assessment & Plan Note (Signed)
Has been on amoxicillin and hasn't really responded Concern for MRSA (appearance could be this) Will add doxycycline x 7 days Consider change to augmentin if doesn't respond

## 2019-05-13 NOTE — Progress Notes (Signed)
Subjective:    Patient ID: Jared Bradley, male    DOB: Jan 28, 1960, 59 y.o.   MRN: [id number]  HPI Here due to concerns from an redness on left knee This visit occurred during the SARS-CoV-2 public health emergency.  Safety protocols were in place, including screening questions prior to the visit, additional usage of staff PPE, and extensive cleaning of exam room while observing appropriate contact time as indicated for disinfecting solutions.   Started about a week ago Looked like ingrown hair----he squeezed, popped and drained it Doesn't remember any bite---but not sure Worsened over the past few days and continues to drain Has improved some over the past couple of days Started on amoxicillin 2 days ago 500 tid ---left over from recent root canal  Current Outpatient Medications on File Prior to Visit  Medication Sig Dispense Refill  . amLODipine (NORVASC) 10 MG tablet TAKE 1 TABLET BY MOUTH EVERY DAY FOR BLOOD PRESSURE 90 tablet 3  . amoxicillin (AMOXIL) 500 MG tablet Take 500 mg by mouth in the morning, at noon, and at bedtime.    . ASPIRIN 81 PO Take 81 mg by mouth daily.    . hydrochlorothiazide (HYDRODIURIL) 25 MG tablet Take 1 tablet (25 mg total) by mouth daily. For blood pressure. 90 tablet 3  . lisinopril (ZESTRIL) 40 MG tablet Take 1 tablet (40 mg total) by mouth daily. For blood pressure 90 tablet 3  . metFORMIN (GLUCOPHAGE-XR) 500 MG 24 hr tablet Take 1 tablet (500 mg total) by mouth daily with breakfast. For diabetes. 90 tablet 3  . PARoxetine (PAXIL) 20 MG tablet Take 1 tablet (20 mg total) by mouth daily. 90 tablet 3  . rosuvastatin (CRESTOR) 10 MG tablet Take 1 tablet by mouth every evening for cholesterol. 90 tablet 3  . sildenafil (REVATIO) 20 MG tablet Take 2-5 tablets by mouth 30 minutes prior to intercourse. 50 tablet 0   No current facility-administered medications on file prior to visit.    Allergies  Allergen Reactions  . Hydrochlorothiazide  W-Triamterene Nausea Only  . Rosuvastatin Other (See Comments)    Started after 5 years of treatment  . Lipitor [Atorvastatin] Other (See Comments)    Myalgias    Past Medical History:  Diagnosis Date  . Anxiety   . Hyperlipidemia   . Hypertension     History reviewed. No pertinent surgical history.  Family History  Problem Relation Age of Onset  . Heart disease Father   . Hypertension Father   . Hyperlipidemia Father   . Heart disease Sister   . Diabetes Brother   . Hypertension Maternal Grandmother     Social History   Socioeconomic History  . Marital status: Married    Spouse name: Not on file  . Number of children: Not on file  . Years of education: Not on file  . Highest education level: Not on file  Occupational History  . Not on file  Tobacco Use  . Smoking status: Former Research scientist (life sciences)  . Smokeless tobacco: Never Used  Substance and Sexual Activity  . Alcohol use: No  . Drug use: No  . Sexual activity: Not on file  Other Topics Concern  . Not on file  Social History Narrative   Married.   3 children.   Works as a Administrator   Enjoys IT sales professional Strain:   . Difficulty of Paying Living Expenses:   Food Insecurity:   . Worried  About Running Out of Food in the Last Year:   . Ran Out of Food in the Last Year:   Transportation Needs:   . Lack of Transportation (Medical):   Marland Kitchen Lack of Transportation (Non-Medical):   Physical Activity:   . Days of Exercise per Week:   . Minutes of Exercise per Session:   Stress:   . Feeling of Stress :   Social Connections:   . Frequency of Communication with Friends and Family:   . Frequency of Social Gatherings with Friends and Family:   . Attends Religious Services:   . Active Member of Clubs or Organizations:   . Attends Banker Meetings:   Marland Kitchen Marital Status:   Intimate Partner Violence:   . Fear of Current or Ex-Partner:   . Emotionally Abused:   Marland Kitchen  Physically Abused:   . Sexually Abused:    Review of Systems No fever No N/V    Objective:   Physical Exam  Constitutional: He appears well-developed. No distress.  Skin:  ~9-44mm ulcer with some discharge at upper part of left patella (superficial though) ~4cm of surrounding redness, warmth           Assessment & Plan:

## 2019-05-22 ENCOUNTER — Other Ambulatory Visit: Payer: Self-pay | Admitting: Primary Care

## 2019-05-22 DIAGNOSIS — E119 Type 2 diabetes mellitus without complications: Secondary | ICD-10-CM

## 2019-06-29 ENCOUNTER — Encounter: Payer: Self-pay | Admitting: Primary Care

## 2019-06-29 ENCOUNTER — Telehealth (INDEPENDENT_AMBULATORY_CARE_PROVIDER_SITE_OTHER): Payer: BC Managed Care – PPO | Admitting: Primary Care

## 2019-06-29 DIAGNOSIS — R059 Cough, unspecified: Secondary | ICD-10-CM | POA: Insufficient documentation

## 2019-06-29 DIAGNOSIS — J069 Acute upper respiratory infection, unspecified: Secondary | ICD-10-CM | POA: Diagnosis not present

## 2019-06-29 MED ORDER — BENZONATATE 200 MG PO CAPS: 200.0000 mg | ORAL_CAPSULE | Freq: Three times a day (TID) | ORAL | 0 refills | Status: DC | PRN

## 2019-06-29 MED ORDER — PREDNISONE 20 MG PO TABS: ORAL_TABLET | ORAL | 0 refills | Status: DC

## 2019-06-29 NOTE — Progress Notes (Signed)
Subjective:    Patient ID: Jared Bradley, male    DOB: 22-Feb-1960, 59 y.o.   MRN: [id number]  HPI  Virtual Visit via Video Note  I connected with Jared Bradley on 06/29/19 at  9:40 AM EDT by a video enabled telemedicine application and verified that I am speaking with the correct person using two identifiers.  Location: Patient: Home Provider: Office   I discussed the limitations of evaluation and management by telemedicine and the availability of in person appointments. The patient expressed understanding and agreed to proceed.  History of Present Illness:  Jared Bradley is a 59 year old male with a history of sleep apnea, hypertension, GAD, type 2 diabetes, hyperlipidemia who presents today with a chief complaint of cough.  He also endorses shortness of breath with cough, nasal congestion with rhinorrhea, headaches, body aches. His cough is productive with green mucous. Symptoms began four days ago. He denies fevers, diarrhea, loss of taste/smell, known exposure to Covid-19.    He's taken Advil Sinus, Robitussin DM, Advil for body aches with temporary improvement. He's not sleeping well due to cough, cannot use his CPAP machine.   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 Given his shortness of breath and cough he will be unable to complete this work.    Observations/Objective:  Alert and oriented. Appears tired, not sickly. No distress. Speaking in complete sentences. Congested cough noted a few times during visit.  Assessment and Plan:  Acute for the last 4 days, temporary improvement with OTC treatment. Video exam today with obvious congested cough. Symptoms compatible with acute URI, could certainly be Covid-19, he denies known exposure.  Will treat with oral prednisone given dyspnea. Rx for [employer] provided. Consider chest xray vs Covid-19 testing if no improvement  within a few days. He will update later this week. Work note provided. He will remain home.  Follow Up Instructions:  Start prednisone 20 mg. Take 2 tablets daily for five days.  You may take Benzonatate capsules for cough. Take 1 capsule by mouth three times daily as needed for cough.  Be sure to drink plenty of water and rest.  Please update me Thursday this week. Consider getting Covid tested.  It was a pleasure to see you today! Mayra Reel, NP-C    I discussed the assessment and treatment plan with the patient. The patient was provided an opportunity to ask questions and all were answered. The patient agreed with the plan and demonstrated an understanding of the instructions.   The patient was advised to call back or seek an in-person evaluation if the symptoms worsen or if the condition fails to improve as anticipated.    Doreene Nest, NP    Review of Systems  Constitutional: Negative for fever.  HENT: Positive for congestion and sinus pressure.   Respiratory: Positive for cough and shortness of breath.   Musculoskeletal: Positive for arthralgias and myalgias.  Allergic/Immunologic: Positive for environmental allergies.       Past Medical History:  Diagnosis Date  . Anxiety   . Hyperlipidemia   . Hypertension      Social History   Socioeconomic History  . Marital status: Married    Spouse name: Not on file  . Number of children: Not on file  . Years of education: Not on file  . Highest education level: Not on file  Occupational History  . Not on file  Tobacco Use  . Smoking status: Former Research scientist (life sciences)  . Smokeless tobacco: Never Used  Substance and Sexual Activity  . Alcohol use: No  . Drug use: No  . Sexual activity: Not on file  Other Topics Concern  . Not on file  Social History Narrative   Married.   3 children.   Works as a Administrator   Enjoys IT sales professional Strain:   . Difficulty of Paying  Living Expenses:   Food Insecurity:   . Worried About Charity fundraiser in the Last Year:   . Arboriculturist in the Last Year:   Transportation Needs:   . Film/video editor (Medical):   Marland Kitchen Lack of Transportation (Non-Medical):   Physical Activity:   . Days of Exercise per Week:   . Minutes of Exercise per Session:   Stress:   . Feeling of Stress :   Social Connections:   . Frequency of Communication with Friends and Family:   . Frequency of Social Gatherings with Friends and Family:   . Attends Religious Services:   . Active Member of Clubs or Organizations:   . Attends Archivist Meetings:   Marland Kitchen Marital Status:   Intimate Partner Violence:   . Fear of Current or Ex-Partner:   . Emotionally Abused:   Marland Kitchen Physically Abused:   . Sexually Abused:     No past surgical history on file.  Family History  Problem Relation Age of Onset  . Heart disease Father   . Hypertension Father   . Hyperlipidemia Father   . Heart disease Sister   . Diabetes Brother   . Hypertension Maternal Grandmother     Allergies  Allergen Reactions  . Hydrochlorothiazide W-Triamterene Nausea Only  . Rosuvastatin Other (See Comments)    Started after 5 years of treatment  . Lipitor [Atorvastatin] Other (See Comments)    Myalgias    Current Outpatient Medications on File Prior to Visit  Medication Sig Dispense Refill  . amLODipine (NORVASC) 10 MG tablet TAKE 1 TABLET BY MOUTH EVERY DAY FOR BLOOD PRESSURE 90 tablet 3  . ASPIRIN 81 PO Take 81 mg by mouth daily.    Marland Kitchen doxycycline (VIBRA-TABS) 100 MG tablet Take 1 tablet (100 mg total) by mouth 2 (two) times daily. 14 tablet 1  . hydrochlorothiazide (HYDRODIURIL) 25 MG tablet Take 1 tablet (25 mg total) by mouth daily. For blood pressure. 90 tablet 3  . lisinopril (ZESTRIL) 40 MG tablet Take 1 tablet (40 mg total) by mouth daily. For blood pressure 90 tablet 3  . metFORMIN (GLUCOPHAGE-XR) 500 MG 24 hr tablet Take 1 tablet (500 mg total) by  mouth daily with breakfast. For diabetes. 90 tablet 3  . PARoxetine (PAXIL) 20 MG tablet Take 1 tablet (20 mg total) by mouth daily. 90 tablet 3  . rosuvastatin (CRESTOR) 10 MG tablet Take 1 tablet by mouth every evening for cholesterol. 90 tablet 3  . sildenafil (REVATIO) 20 MG tablet Take 2-5 tablets by mouth 30 minutes prior to intercourse. 50 tablet 0  . amoxicillin (AMOXIL) 500 MG tablet Take 500 mg by mouth in the morning, at noon, and at bedtime.     No current facility-administered medications on file prior to visit.    There were no vitals taken for this visit.   Objective:   Physical Exam  Constitutional: He is oriented to person,  place, and time. He appears well-nourished.  Respiratory: Effort normal. No respiratory distress.  Congested cough noted  Neurological: He is alert and oriented to person, place, and time.  Psychiatric: He has a normal mood and affect.           Assessment & Plan:

## 2019-06-29 NOTE — Assessment & Plan Note (Signed)
Acute for the last 4 days, temporary improvement with OTC treatment. Video exam today with obvious congested cough. Symptoms compatible with acute URI, could certainly be Covid-19, he denies known exposure.  Will treat with oral prednisone given dyspnea. Rx for [employer] provided. Consider chest xray vs Covid-19 testing if no improvement within a few days. He will update later this week. Work note provided. He will remain home.

## 2019-06-29 NOTE — Patient Instructions (Signed)
Start prednisone 20 mg. Take 2 tablets daily for five days.  You may take Benzonatate capsules for cough. Take 1 capsule by mouth three times daily as needed for cough.  Be sure to drink plenty of water and rest.  Please update me Thursday this week. Consider getting Covid tested.  It was a pleasure to see you today! Mayra Reel, NP-

## 2019-07-03 ENCOUNTER — Other Ambulatory Visit: Payer: Self-pay

## 2019-07-03 ENCOUNTER — Ambulatory Visit (INDEPENDENT_AMBULATORY_CARE_PROVIDER_SITE_OTHER): Payer: BC Managed Care – PPO | Admitting: Primary Care

## 2019-07-03 ENCOUNTER — Encounter: Payer: Self-pay | Admitting: Primary Care

## 2019-07-03 ENCOUNTER — Ambulatory Visit (INDEPENDENT_AMBULATORY_CARE_PROVIDER_SITE_OTHER)
Admission: RE | Admit: 2019-07-03 | Discharge: 2019-07-03 | Disposition: A | Discharge status: Home / Self Care | Payer: BC Managed Care – PPO | Source: Ambulatory Visit | Attending: Primary Care | Admitting: Primary Care

## 2019-07-03 ENCOUNTER — Other Ambulatory Visit: Payer: Self-pay | Admitting: Primary Care

## 2019-07-03 ENCOUNTER — Telehealth: Payer: Self-pay

## 2019-07-03 VITALS — BP 126/78 | HR 102 | Temp 95.8°F | Ht 73.0 in | Wt 308.0 lb

## 2019-07-03 DIAGNOSIS — I10 Essential (primary) hypertension: Secondary | ICD-10-CM

## 2019-07-03 DIAGNOSIS — R059 Cough, unspecified: Secondary | ICD-10-CM

## 2019-07-03 DIAGNOSIS — R05 Cough: Secondary | ICD-10-CM | POA: Diagnosis not present

## 2019-07-03 DIAGNOSIS — J069 Acute upper respiratory infection, unspecified: Secondary | ICD-10-CM | POA: Diagnosis not present

## 2019-07-03 MED ORDER — BENZONATATE 200 MG PO CAPS: 200.0000 mg | ORAL_CAPSULE | Freq: Three times a day (TID) | ORAL | 0 refills | Status: DC | PRN

## 2019-07-03 MED ORDER — ALBUTEROL SULFATE HFA 108 (90 BASE) MCG/ACT IN AERS: 1.0000 | INHALATION_SPRAY | Freq: Four times a day (QID) | RESPIRATORY_TRACT | 0 refills | Status: DC | PRN

## 2019-07-03 MED ORDER — LOSARTAN POTASSIUM 100 MG PO TABS: 100.0000 mg | ORAL_TABLET | Freq: Every day | ORAL | 3 refills | Status: DC

## 2019-07-03 NOTE — Assessment & Plan Note (Signed)
Discontinuing lisinopril due to persistent cough. Start losartan 100 mg daily. Continue hydrochlorothiazide and amlodipine.

## 2019-07-03 NOTE — Patient Instructions (Signed)
Stop taking lisinopril 40 mg for blood pressure. Start taking losartan 100 mg for blood pressure.  Stop by the lab and xray prior to leaving today.   I'll be in touch with results soon!  It was a pleasure to see you today!

## 2019-07-03 NOTE — Assessment & Plan Note (Signed)
Slight improvement, cough and exertional dyspnea persist.  Negative COVID-19 test.  Lungs today are actually quite clear. Chest x-ray and lab work pending today.  Refill provided for benzonatate capsules. Stop lisinopril, switch to losartan.  Consider antibiotic treatment if needed.

## 2019-07-03 NOTE — Progress Notes (Signed)
Subjective:    Patient ID: Jared Bradley, male    DOB: [birth year], 59 y.o.   MRN: [id number]  HPI  This visit occurred during the SARS-CoV-2 public health emergency.  Safety protocols were in place, including screening questions prior to the visit, additional usage of staff PPE, and extensive cleaning of exam room while observing appropriate contact time as indicated for disinfecting solutions.   Jared Bradley is a 59 year old male with a history of hypertension, sleep apnea, type 2 diabetes, GAD, tobacco abuse who presents today with a chief complaint of cough.  He was last evaluated virtually on 06/29/19 for a four day history of productive cough, nasal congestion, rhinorrhea, headaches, body aches. Give symptoms we recommended Covid-19 testing and conservative treatment.   Since his last visit he's tested negative for Covid-19 and he's slightly better. He continues to cough and experience exertional dyspnea with wheezing, also with coughing spells. His cough is non productive now, he denies body aches/chills.    He is managed on lisinopril 40 mg for hypertension. He quit smoking 20 years ago. He finished prednisone which didn't seem to help much with exertional dyspnea. The [employer] are helping temporarily.   He denies a childhood history of asthma. He was once managed on albuterol PRN prior to sports and during illness.   Review of Systems  Constitutional: Negative for chills, fatigue and fever.  Respiratory: Positive for cough and shortness of breath.   Cardiovascular: Negative for chest pain.       Past Medical History:  Diagnosis Date  . Anxiety   . Hyperlipidemia   . Hypertension      Social History   Socioeconomic History  . Marital status: Married    Spouse name: Not on file  . Number of children: Not on file  . Years of education: Not on file  . Highest education level: Not on file  Occupational History  . Not on file  Tobacco Use  . Smoking  status: Former Research scientist (life sciences)  . Smokeless tobacco: Never Used  Substance and Sexual Activity  . Alcohol use: No  . Drug use: No  . Sexual activity: Not on file  Other Topics Concern  . Not on file  Social History Narrative   Married.   3 children.   Works as a Administrator   Enjoys IT sales professional Strain:   . Difficulty of Paying Living Expenses:   Food Insecurity:   . Worried About Charity fundraiser in the Last Year:   . Arboriculturist in the Last Year:   Transportation Needs:   . Film/video editor (Medical):   Marland Kitchen Lack of Transportation (Non-Medical):   Physical Activity:   . Days of Exercise per Week:   . Minutes of Exercise per Session:   Stress:   . Feeling of Stress :   Social Connections:   . Frequency of Communication with Friends and Family:   . Frequency of Social Gatherings with Friends and Family:   . Attends Religious Services:   . Active Member of Clubs or Organizations:   . Attends Archivist Meetings:   Marland Kitchen Marital Status:   Intimate Partner Violence:   . Fear of Current or Ex-Partner:   . Emotionally Abused:   Marland Kitchen Physically Abused:   . Sexually Abused:     History reviewed. No pertinent surgical history.  Family History  Problem Relation Age of Onset  .  Heart disease Father   . Hypertension Father   . Hyperlipidemia Father   . Heart disease Sister   . Diabetes Brother   . Hypertension Maternal Grandmother     Allergies  Allergen Reactions  . Hydrochlorothiazide W-Triamterene Nausea Only  . Rosuvastatin Other (See Comments)    Started after 5 years of treatment  . Lipitor [Atorvastatin] Other (See Comments)    Myalgias    Current Outpatient Medications on File Prior to Visit  Medication Sig Dispense Refill  . amLODipine (NORVASC) 10 MG tablet TAKE 1 TABLET BY MOUTH EVERY DAY FOR BLOOD PRESSURE 90 tablet 3  . amoxicillin (AMOXIL) 500 MG tablet Take 500 mg by mouth in the morning, at  noon, and at bedtime.    . ASPIRIN 81 PO Take 81 mg by mouth daily.    . hydrochlorothiazide (HYDRODIURIL) 25 MG tablet Take 1 tablet (25 mg total) by mouth daily. For blood pressure. 90 tablet 3  . metFORMIN (GLUCOPHAGE-XR) 500 MG 24 hr tablet Take 1 tablet (500 mg total) by mouth daily with breakfast. For diabetes. 90 tablet 3  . PARoxetine (PAXIL) 20 MG tablet Take 1 tablet (20 mg total) by mouth daily. 90 tablet 3  . predniSONE (DELTASONE) 20 MG tablet Take 2 tablets daily for five days. 10 tablet 0  . rosuvastatin (CRESTOR) 10 MG tablet Take 1 tablet by mouth every evening for cholesterol. 90 tablet 3  . sildenafil (REVATIO) 20 MG tablet Take 2-5 tablets by mouth 30 minutes prior to intercourse. 50 tablet 0   No current facility-administered medications on file prior to visit.    BP 126/78   Pulse (!) 102   Temp (!) 95.8 F (35.4 C) (Temporal)   Ht 6\' 1"  (1.854 m)   Wt (!) 308 lb (139.7 kg)   SpO2 95%   BMI 40.64 kg/m    Objective:   Physical Exam  Constitutional: He does not appear ill.  Cardiovascular: Normal rate and regular rhythm.  Respiratory: Effort normal and breath sounds normal. He has no wheezes. He has no rhonchi.  Skin: Skin is warm and dry.           Assessment & Plan:

## 2019-07-03 NOTE — Telephone Encounter (Signed)
Yes, can he come in today in my 11:40 slot?

## 2019-07-03 NOTE — Telephone Encounter (Signed)
Patient was seen for a virtual visit last week due to having a cough, wheezing, and shortness of breath. Patient state he had a COVID test completed, and it was negative. He states he has been taking the Prednisone and Tessalon as prescribed, but he is not feeling any better. He is wondering what he should do, and if he could come in the office for an evaluation. Please advise

## 2019-07-03 NOTE — Telephone Encounter (Signed)
Pt has been scheduled today at 3:20. OK per Jae Dire.  Thanks!

## 2019-07-04 LAB — CBC WITH DIFFERENTIAL/PLATELET
Absolute Monocytes: 230 cells/uL (ref 200–950)
Basophils Absolute: 40 cells/uL (ref 0–200)
Basophils Relative: 0.4 %
Eosinophils Absolute: 200 cells/uL (ref 15–500)
Eosinophils Relative: 2 %
HCT: 43 % (ref 38.5–50.0)
Hemoglobin: 14.3 g/dL (ref 13.2–17.1)
Lymphs Abs: 1300 cells/uL (ref 850–3900)
MCH: 30.4 pg (ref 27.0–33.0)
MCHC: 33.3 g/dL (ref 32.0–36.0)
MCV: 91.5 fL (ref 80.0–100.0)
MPV: 10.7 fL (ref 7.5–12.5)
Monocytes Relative: 2.3 %
Neutro Abs: 8230 cells/uL — ABNORMAL HIGH (ref 1500–7800)
Neutrophils Relative %: 82.3 %
Platelets: 412 10*3/uL — ABNORMAL HIGH (ref 140–400)
RBC: 4.7 10*6/uL (ref 4.20–5.80)
RDW: 12.4 % (ref 11.0–15.0)
Total Lymphocyte: 13 %
WBC: 10 10*3/uL (ref 3.8–10.8)

## 2019-07-04 LAB — BRAIN NATRIURETIC PEPTIDE: Brain Natriuretic Peptide: <4 pg/mL (ref <100)

## 2019-07-08 NOTE — Telephone Encounter (Signed)
Pt dropped off ppw. Placed on cart.

## 2019-07-09 NOTE — Telephone Encounter (Signed)
I gave this paperwork to [facility] this morning, I do not have a copy. Please either find potential copies from [place] or have him send me paperwork. Can I just write a letter stating that he can return to work on 07/12/2019?

## 2019-07-09 NOTE — Telephone Encounter (Signed)
Pt states that he needs the date changed on his return to work paperwork.  Return date needs to be 07/12/19 not 07/13/19.   This will need to be re-faxed asap.

## 2019-07-09 NOTE — Telephone Encounter (Signed)
Jared Dire,   Do you have this?

## 2019-07-19 ENCOUNTER — Other Ambulatory Visit: Payer: Self-pay | Admitting: Primary Care

## 2019-07-19 DIAGNOSIS — J069 Acute upper respiratory infection, unspecified: Secondary | ICD-10-CM

## 2019-07-23 ENCOUNTER — Encounter: Payer: Self-pay | Admitting: Primary Care

## 2019-07-23 ENCOUNTER — Other Ambulatory Visit: Payer: Self-pay

## 2019-07-23 ENCOUNTER — Ambulatory Visit (INDEPENDENT_AMBULATORY_CARE_PROVIDER_SITE_OTHER): Payer: BC Managed Care – PPO | Admitting: Primary Care

## 2019-07-23 VITALS — BP 126/82 | HR 86 | Temp 95.4°F | Ht 73.0 in | Wt 307.8 lb

## 2019-07-23 DIAGNOSIS — I10 Essential (primary) hypertension: Secondary | ICD-10-CM | POA: Diagnosis not present

## 2019-07-23 DIAGNOSIS — L237 Allergic contact dermatitis due to plants, except food: Secondary | ICD-10-CM

## 2019-07-23 DIAGNOSIS — R05 Cough: Secondary | ICD-10-CM | POA: Diagnosis not present

## 2019-07-23 DIAGNOSIS — R059 Cough, unspecified: Secondary | ICD-10-CM

## 2019-07-23 HISTORY — DX: Allergic contact dermatitis due to plants, except food: L23.7

## 2019-07-23 MED ORDER — PREDNISONE 20 MG PO TABS: ORAL_TABLET | ORAL | 0 refills | Status: DC

## 2019-07-23 NOTE — Assessment & Plan Note (Signed)
Consider discontinuing losartan as cough has returned.  We will treat for allergies, he will update.

## 2019-07-23 NOTE — Telephone Encounter (Signed)
Pt has been scheduled today at 11:40 to come in

## 2019-07-23 NOTE — Assessment & Plan Note (Signed)
Cough temporarily resolved until last night.  His cough today is suspicious for ARB induced cough, but given that he was outside clearing brush we will start with treatment for allergies.  He will start a daily antihistamine.  Consider PPI if needed.  He will update if cough persist.

## 2019-07-23 NOTE — Patient Instructions (Signed)
Start prednisone. Take 2 tablets daily for four days, then 1 tablet daily for four days.  Start taking an allergy pill like Allegra, Zyrtec, Claritin.  Please update me in one week if your cough persists.   It was a pleasure to see you today!

## 2019-07-23 NOTE — Progress Notes (Signed)
Subjective:    Patient ID: Jared Bradley, male    DOB: 1960-04-08, 58 y.o.   MRN: [id number]  HPI  This visit occurred during the SARS-CoV-2 public health emergency.  Safety protocols were in place, including screening questions prior to the visit, additional usage of staff PPE, and extensive cleaning of exam room while observing appropriate contact time as indicated for disinfecting solutions.   Jared Bradley is a 59 year old male with a history of hypertension, OSA, type 2 diabetes, poison ivy dermatitis who presents today with a chief complaint of rash.   His rash is located to the bilateral upper extremities and anterior trunk that began about four days ago. The day prior to his rash he was out taking out brush, saw poison ivy.   His rash is itchy, but overall tolerable.  Of note, his cough returned last night.  He was treated several weeks ago for acute upper respiratory tract infection that was Covid negative.  We decided to discontinue his lisinopril given dry cough with a tickle to his throat.  He was switched to losartan.  He was also prescribed prednisone at the time.  His cough resolved up until last night, occurs during conversation mostly.  He denies fevers, feeling bad, esophageal reflux.  BP Readings from Last 3 Encounters:  07/23/19 126/82  07/03/19 126/78  05/13/19 116/74     Review of Systems  Constitutional: Negative for fever.  Respiratory: Positive for cough.   Skin: Positive for rash.       Past Medical History:  Diagnosis Date   Anxiety    Hyperlipidemia    Hypertension      Social History   Socioeconomic History   Marital status: Married    Spouse name: Not on file   Number of children: Not on file   Years of education: Not on file   Highest education level: Not on file  Occupational History   Not on file  Tobacco Use   Smoking status: Former Smoker   Smokeless tobacco: Never Used  Substance and Sexual Activity   Alcohol  use: No   Drug use: No   Sexual activity: Not on file  Other Topics Concern   Not on file  Social History Narrative   Married.   3 children.   Works as a Naval architect   Enjoys Contractor:    Difficulty of Paying Living Expenses:   Food Insecurity:    Worried About Programme researcher, broadcasting/film/video in the Last Year:    Barista in the Last Year:   Transportation Needs:    Freight forwarder (Medical):    Lack of Transportation (Non-Medical):   Physical Activity:    Days of Exercise per Week:    Minutes of Exercise per Session:   Stress:    Feeling of Stress :   Social Connections:    Frequency of Communication with Friends and Family:    Frequency of Social Gatherings with Friends and Family:    Attends Religious Services:    Active Member of Clubs or Organizations:    Attends Engineer, structural:    Marital Status:   Intimate Partner Violence:    Fear of Current or Ex-Partner:    Emotionally Abused:    Physically Abused:    Sexually Abused:     No past surgical history on file.  Family History  Problem Relation Age of  Onset   Heart disease Father    Hypertension Father    Hyperlipidemia Father    Heart disease Sister    Diabetes Brother    Hypertension Maternal Grandmother     Allergies  Allergen Reactions   Hydrochlorothiazide W-Triamterene Nausea Only   Rosuvastatin Other (See Comments)    Started after 5 years of treatment   Lipitor [Atorvastatin] Other (See Comments)    Myalgias    Current Outpatient Medications on File Prior to Visit  Medication Sig Dispense Refill   albuterol (VENTOLIN HFA) 108 (90 Base) MCG/ACT inhaler INHALE 1 PUFF INTO THE LUNGS EVERY 6 (SIX) HOURS AS NEEDED FOR WHEEZING OR SHORTNESS OF BREATH. 6.7 g 0   amLODipine (NORVASC) 10 MG tablet TAKE 1 TABLET BY MOUTH EVERY DAY FOR BLOOD PRESSURE 90 tablet 3   amoxicillin (AMOXIL) 500 MG  tablet Take 500 mg by mouth in the morning, at noon, and at bedtime.     ASPIRIN 81 PO Take 81 mg by mouth daily.     benzonatate (TESSALON) 200 MG capsule Take 1 capsule (200 mg total) by mouth 3 (three) times daily as needed for cough. 15 capsule 0   hydrochlorothiazide (HYDRODIURIL) 25 MG tablet Take 1 tablet (25 mg total) by mouth daily. For blood pressure. 90 tablet 3   losartan (COZAAR) 100 MG tablet Take 1 tablet (100 mg total) by mouth daily. For blood pressure. 90 tablet 3   metFORMIN (GLUCOPHAGE-XR) 500 MG 24 hr tablet Take 1 tablet (500 mg total) by mouth daily with breakfast. For diabetes. 90 tablet 3   PARoxetine (PAXIL) 20 MG tablet Take 1 tablet (20 mg total) by mouth daily. 90 tablet 3   rosuvastatin (CRESTOR) 10 MG tablet Take 1 tablet by mouth every evening for cholesterol. 90 tablet 3   sildenafil (REVATIO) 20 MG tablet Take 2-5 tablets by mouth 30 minutes prior to intercourse. 50 tablet 0   No current facility-administered medications on file prior to visit.    BP 126/82    Pulse 86    Temp (!) 95.4 F (35.2 C) (Temporal)    Ht 6\' 1"  (1.854 m)    Wt (!) 307 lb 12 oz (139.6 kg)    SpO2 97%    BMI 40.60 kg/m    Objective:   Physical Exam Constitutional:      Appearance: He is not ill-appearing.  Pulmonary:     Breath sounds: No wheezing, rhonchi or rales.     Comments: Persistent dry cough during exam. Skin:    General: Skin is warm and dry.     Findings: Rash present.     Comments: Rash to bilateral upper extremities with intact blistering.            Assessment & Plan:

## 2019-07-23 NOTE — Telephone Encounter (Signed)
Hey, FYI. We can add him at 11:40 or 12 today. Not sure if he wants to come in or do a virtual visit. Can you help?

## 2019-07-23 NOTE — Assessment & Plan Note (Signed)
Exam today representative of poison ivy dermatitis.  He was exposed to poison ivy.  Prescription for prednisone course sent to pharmacy.  He will update.

## 2019-10-06 ENCOUNTER — Other Ambulatory Visit: Payer: Self-pay | Admitting: Primary Care

## 2019-10-06 DIAGNOSIS — E785 Hyperlipidemia, unspecified: Secondary | ICD-10-CM

## 2019-10-06 DIAGNOSIS — Z125 Encounter for screening for malignant neoplasm of prostate: Secondary | ICD-10-CM

## 2019-10-06 DIAGNOSIS — I10 Essential (primary) hypertension: Secondary | ICD-10-CM

## 2019-10-06 DIAGNOSIS — E119 Type 2 diabetes mellitus without complications: Secondary | ICD-10-CM

## 2019-10-19 ENCOUNTER — Other Ambulatory Visit: Payer: BC Managed Care – PPO

## 2019-10-20 ENCOUNTER — Other Ambulatory Visit (INDEPENDENT_AMBULATORY_CARE_PROVIDER_SITE_OTHER): Payer: BC Managed Care – PPO

## 2019-10-20 ENCOUNTER — Other Ambulatory Visit: Payer: Self-pay

## 2019-10-20 DIAGNOSIS — E119 Type 2 diabetes mellitus without complications: Secondary | ICD-10-CM | POA: Diagnosis not present

## 2019-10-20 DIAGNOSIS — E785 Hyperlipidemia, unspecified: Secondary | ICD-10-CM

## 2019-10-20 DIAGNOSIS — I10 Essential (primary) hypertension: Secondary | ICD-10-CM

## 2019-10-20 DIAGNOSIS — Z125 Encounter for screening for malignant neoplasm of prostate: Secondary | ICD-10-CM | POA: Diagnosis not present

## 2019-10-20 LAB — LIPID PANEL
Cholesterol: 148 mg/dL (ref 0–200)
HDL: 32.7 mg/dL — ABNORMAL LOW (ref >39.00)
LDL Cholesterol: 92 mg/dL (ref 0–99)
NonHDL: 115.66
Total CHOL/HDL Ratio: 5
Triglycerides: 118 mg/dL (ref 0.0–149.0)
VLDL: 23.6 mg/dL (ref 0.0–40.0)

## 2019-10-20 LAB — CBC
HCT: 43.5 % (ref 39.0–52.0)
Hemoglobin: 14.5 g/dL (ref 13.0–17.0)
MCHC: 33.3 g/dL (ref 30.0–36.0)
MCV: 90.6 fl (ref 78.0–100.0)
Platelets: 294 10*3/uL (ref 150.0–400.0)
RBC: 4.8 Mil/uL (ref 4.22–5.81)
RDW: 13 % (ref 11.5–15.5)
WBC: 6.3 10*3/uL (ref 4.0–10.5)

## 2019-10-20 LAB — COMPREHENSIVE METABOLIC PANEL
ALT: 37 U/L (ref 0–53)
AST: 29 U/L (ref 0–37)
Albumin: 4.2 g/dL (ref 3.5–5.2)
Alkaline Phosphatase: 82 U/L (ref 39–117)
BUN: 18 mg/dL (ref 6–23)
CO2: 33 mEq/L — ABNORMAL HIGH (ref 19–32)
Calcium: 9.6 mg/dL (ref 8.4–10.5)
Chloride: 100 mEq/L (ref 96–112)
Creatinine, Ser: 0.89 mg/dL (ref 0.40–1.50)
GFR: 88.07 mL/min (ref >60.00)
Glucose, Bld: 117 mg/dL — ABNORMAL HIGH (ref 70–99)
Potassium: 4.7 mEq/L (ref 3.5–5.1)
Sodium: 137 mEq/L (ref 135–145)
Total Bilirubin: 0.5 mg/dL (ref 0.2–1.2)
Total Protein: 7 g/dL (ref 6.0–8.3)

## 2019-10-20 LAB — PSA: PSA: 1 ng/mL (ref 0.10–4.00)

## 2019-10-20 LAB — HEMOGLOBIN A1C: Hgb A1c MFr Bld: 6.9 % — ABNORMAL HIGH (ref 4.6–6.5)

## 2019-10-23 ENCOUNTER — Other Ambulatory Visit: Payer: Self-pay

## 2019-10-23 ENCOUNTER — Encounter: Payer: Self-pay | Admitting: Primary Care

## 2019-10-23 ENCOUNTER — Ambulatory Visit (INDEPENDENT_AMBULATORY_CARE_PROVIDER_SITE_OTHER): Payer: BC Managed Care – PPO | Admitting: Primary Care

## 2019-10-23 VITALS — BP 130/78 | HR 68 | Temp 98.1°F | Ht 73.0 in | Wt 298.0 lb

## 2019-10-23 DIAGNOSIS — E785 Hyperlipidemia, unspecified: Secondary | ICD-10-CM

## 2019-10-23 DIAGNOSIS — I10 Essential (primary) hypertension: Secondary | ICD-10-CM | POA: Diagnosis not present

## 2019-10-23 DIAGNOSIS — Z23 Encounter for immunization: Secondary | ICD-10-CM

## 2019-10-23 DIAGNOSIS — G4730 Sleep apnea, unspecified: Secondary | ICD-10-CM | POA: Diagnosis not present

## 2019-10-23 DIAGNOSIS — E119 Type 2 diabetes mellitus without complications: Secondary | ICD-10-CM | POA: Diagnosis not present

## 2019-10-23 DIAGNOSIS — Z0000 Encounter for general adult medical examination without abnormal findings: Secondary | ICD-10-CM | POA: Diagnosis not present

## 2019-10-23 DIAGNOSIS — N529 Male erectile dysfunction, unspecified: Secondary | ICD-10-CM

## 2019-10-23 DIAGNOSIS — F411 Generalized anxiety disorder: Secondary | ICD-10-CM

## 2019-10-23 NOTE — Patient Instructions (Addendum)
Start exercising. You should be getting 150 minutes of moderate intensity exercise weekly.  It's important to improve your diet by reducing consumption of fast food, fried food, processed snack foods, sugary drinks. Increase consumption of fresh vegetables and fruits, whole grains, water.  Ensure you are drinking 64 ounces of water daily.  Please schedule a follow up appointment in 6 months for diabetes follow up.  Please send my chart message with Covid vaccination dates and manufacture.       Influenza (Flu) Vaccine (Inactivated or Recombinant): What You Need to Know 1. Why get vaccinated? Influenza vaccine can prevent influenza (flu). Flu is a contagious disease that spreads around the [place] every year, usually between October and May. Anyone can get the flu, but it is more dangerous for some people. Infants and young children, people 90 years of age and older, pregnant women, and people with certain health conditions or a weakened immune system are at greatest risk of flu complications. Pneumonia, bronchitis, sinus infections and ear infections are examples of flu-related complications. If you have a medical condition, such as heart disease, cancer or diabetes, flu can make it worse. Flu can cause fever and chills, sore throat, muscle aches, fatigue, cough, headache, and runny or stuffy nose. Some people may have vomiting and diarrhea, though this is more common in children than adults. Each year thousands of people in the [place] die from flu, and many more are hospitalized. Flu vaccine prevents millions of illnesses and flu-related visits to the doctor each year. 2. Influenza vaccine CDC recommends everyone 49 months of age and older get vaccinated every flu season. Children 6 months through 31 years of age may need 2 doses during a single flu season. Everyone else needs only 1 dose each flu season. It takes about 2 weeks for protection to develop after vaccination. There are  many flu viruses, and they are always changing. Each year a new flu vaccine is made to protect against three or four viruses that are likely to cause disease in the upcoming flu season. Even when the vaccine doesn't exactly match these viruses, it may still provide some protection. Influenza vaccine does not cause flu. Influenza vaccine may be given at the same time as other vaccines. 3. Talk with your health care provider Tell your vaccine provider if the person getting the vaccine:  Has had an allergic reaction after a previous dose of influenza vaccine, or has any severe, life-threatening allergies.  Has ever had Guillain-Barr Syndrome (also called GBS). In some cases, your health care provider may decide to postpone influenza vaccination to a future visit. People with minor illnesses, such as a cold, may be vaccinated. People who are moderately or severely ill should usually wait until they recover before getting influenza vaccine. Your health care provider can give you more information. 4. Risks of a vaccine reaction  Soreness, redness, and swelling where shot is given, fever, muscle aches, and headache can happen after influenza vaccine.  There may be a very small increased risk of Guillain-Barr Syndrome (GBS) after inactivated influenza vaccine (the flu shot). Young children who get the flu shot along with pneumococcal vaccine (PCV13), and/or DTaP vaccine at the same time might be slightly more likely to have a seizure caused by fever. Tell your health care provider if a child who is getting flu vaccine has ever had a seizure. People sometimes faint after medical procedures, including vaccination. Tell your provider if you feel dizzy or have vision changes or  ringing in the ears. As with any medicine, there is a very remote chance of a vaccine causing a severe allergic reaction, other serious injury, or death. 5. What if there is a serious problem? An allergic reaction could occur after  the vaccinated person leaves the clinic. If you see signs of a severe allergic reaction (hives, swelling of the face and throat, difficulty breathing, a fast heartbeat, dizziness, or weakness), call 9-1-1 and get the person to the nearest hospital. For other signs that concern you, call your health care provider. Adverse reactions should be reported to the Vaccine Adverse Event Reporting System (VAERS). Your health care provider will usually file this report, or you can do it yourself. Visit the VAERS website at www.vaers.LAgents.no or call 305-781-0020.VAERS is only for reporting reactions, and VAERS staff do not give medical advice. 6. The National Vaccine Injury Compensation Program The [employer] Vaccine Injury Compensation Program (VICP) is a federal program that was created to compensate people who may have been injured by certain vaccines. Visit the VICP website at SpiritualWord.at or call (989) 543-9413 to learn about the program and about filing a claim. There is a time limit to file a claim for compensation. 7. How can I learn more?  Ask your healthcare provider.  Call your local or state health department.  Contact the Centers for Disease Control and Prevention (CDC): ? Call 367-281-9341 (1-800-CDC-INFO) or ? Visit CDC's BiotechRoom.com.cy Vaccine Information Statement (Interim) Inactivated Influenza Vaccine (09/05/2017) This information is not intended to replace advice given to you by your health care provider. Make sure you discuss any questions you have with your health care provider. Document Revised: 04/29/2018 Document Reviewed: 09/09/2017 Elsevier Patient Education  2020 [employer].

## 2019-10-23 NOTE — Progress Notes (Signed)
Subjective:    Patient ID: Jared Bradley, male    DOB: 07-03-1960, 59 y.o.   MRN: [id number]  HPI  This visit occurred during the SARS-CoV-2 public health emergency.  Safety protocols were in place, including screening questions prior to the visit, additional usage of staff PPE, and extensive cleaning of exam room while observing appropriate contact time as indicated for disinfecting solutions.   Mr. Jared Bradley is a 59 year old male who presents today for complete physical.  Immunizations: -Tetanus: Completed in 2015 -Influenza: Due -Shingles: Never completed  -Pneumonia: Completed in 2019 -Covid-19: Completed series   Diet: He endorses a healthy diet as of recent. He's cut back on sugar and carbs. Exercise: He is not exercising   Eye exam: Completes annually  Dental exam: Completes semi-annually   Colonoscopy: Never completed, declines  PSA: 1.00 Hep C Screen: Negative  BP Readings from Last 3 Encounters:  10/23/19 130/78  07/23/19 126/82  07/03/19 126/78   Wt Readings from Last 3 Encounters:  10/23/19 298 lb (135.2 kg)  07/23/19 (!) 307 lb 12 oz (139.6 kg)  07/03/19 (!) 308 lb (139.7 kg)    Review of Systems  Constitutional: Negative for unexpected weight change.  HENT: Negative for rhinorrhea.   Eyes: Negative for visual disturbance.  Respiratory: Negative for cough and shortness of breath.   Cardiovascular: Negative for chest pain.  Gastrointestinal: Negative for constipation and diarrhea.  Genitourinary: Negative for difficulty urinating.  Musculoskeletal: Positive for arthralgias.  Skin: Negative for rash.  Allergic/Immunologic: Negative for environmental allergies.  Neurological: Negative for dizziness.       Occasional headaches  Psychiatric/Behavioral: The patient is not nervous/anxious.        Past Medical History:  Diagnosis Date  . Anxiety   . Cellulitis, leg 05/13/2019  . Hyperlipidemia   . Hypertension      Social History    Socioeconomic History  . Marital status: Married    Spouse name: Not on file  . Number of children: Not on file  . Years of education: Not on file  . Highest education level: Not on file  Occupational History  . Not on file  Tobacco Use  . Smoking status: Former Games developer  . Smokeless tobacco: Never Used  Substance and Sexual Activity  . Alcohol use: No  . Drug use: No  . Sexual activity: Not on file  Other Topics Concern  . Not on file  Social History Narrative   Married.   3 children.   Works as a Naval architect   Enjoys Chartered certified accountant Strain:   . Difficulty of Paying Living Expenses: Not on file  Food Insecurity:   . Worried About Programme researcher, broadcasting/film/video in the Last Year: Not on file  . Ran Out of Food in the Last Year: Not on file  Transportation Needs:   . Lack of Transportation (Medical): Not on file  . Lack of Transportation (Non-Medical): Not on file  Physical Activity:   . Days of Exercise per Week: Not on file  . Minutes of Exercise per Session: Not on file  Stress:   . Feeling of Stress : Not on file  Social Connections:   . Frequency of Communication with Friends and Family: Not on file  . Frequency of Social Gatherings with Friends and Family: Not on file  . Attends Religious Services: Not on file  . Active Member of Clubs or Organizations: Not on file  .  Attends Banker Meetings: Not on file  . Marital Status: Not on file  Intimate Partner Violence:   . Fear of Current or Ex-Partner: Not on file  . Emotionally Abused: Not on file  . Physically Abused: Not on file  . Sexually Abused: Not on file    History reviewed. No pertinent surgical history.  Family History  Problem Relation Age of Onset  . Heart disease Father   . Hypertension Father   . Hyperlipidemia Father   . Heart disease Sister   . Diabetes Brother   . Hypertension Maternal Grandmother     Allergies  Allergen Reactions  .  Hydrochlorothiazide W-Triamterene Nausea Only  . Rosuvastatin Other (See Comments)    Started after 5 years of treatment  . Lipitor [Atorvastatin] Other (See Comments)    Myalgias    Current Outpatient Medications on File Prior to Visit  Medication Sig Dispense Refill  . albuterol (VENTOLIN HFA) 108 (90 Base) MCG/ACT inhaler INHALE 1 PUFF INTO THE LUNGS EVERY 6 (SIX) HOURS AS NEEDED FOR WHEEZING OR SHORTNESS OF BREATH. 6.7 g 0  . amLODipine (NORVASC) 10 MG tablet TAKE 1 TABLET BY MOUTH EVERY DAY FOR BLOOD PRESSURE 90 tablet 3  . ASPIRIN 81 PO Take 81 mg by mouth daily.    . hydrochlorothiazide (HYDRODIURIL) 25 MG tablet Take 1 tablet (25 mg total) by mouth daily. For blood pressure. 90 tablet 3  . losartan (COZAAR) 100 MG tablet Take 1 tablet (100 mg total) by mouth daily. For blood pressure. 90 tablet 3  . metFORMIN (GLUCOPHAGE-XR) 500 MG 24 hr tablet Take 1 tablet (500 mg total) by mouth daily with breakfast. For diabetes. 90 tablet 3  . PARoxetine (PAXIL) 20 MG tablet Take 1 tablet (20 mg total) by mouth daily. 90 tablet 3  . rosuvastatin (CRESTOR) 10 MG tablet Take 1 tablet by mouth every evening for cholesterol. 90 tablet 3   No current facility-administered medications on file prior to visit.    BP 130/78   Pulse 68   Temp 98.1 F (36.7 C) (Temporal)   Ht 6\' 1"  (1.854 m)   Wt 298 lb (135.2 kg)   SpO2 97%   BMI 39.32 kg/m    Objective:   Physical Exam HENT:     Right Ear: Tympanic membrane and ear canal normal.     Left Ear: Tympanic membrane and ear canal normal.  Eyes:     Pupils: Pupils are equal, round, and reactive to light.  Cardiovascular:     Rate and Rhythm: Normal rate and regular rhythm.  Pulmonary:     Effort: Pulmonary effort is normal.     Breath sounds: Normal breath sounds.  Abdominal:     General: Bowel sounds are normal.     Palpations: Abdomen is soft.     Tenderness: There is no abdominal tenderness.  Musculoskeletal:        General: Normal  range of motion.     Cervical back: Neck supple.  Skin:    General: Skin is warm and dry.  Neurological:     Mental Status: He is alert and oriented to person, place, and time.     Cranial Nerves: No cranial nerve deficit.     Deep Tendon Reflexes:     Reflex Scores:      Patellar reflexes are 2+ on the right side and 2+ on the left side. Psychiatric:        Mood and Affect: Mood normal.  Assessment & Plan:

## 2019-10-23 NOTE — Assessment & Plan Note (Signed)
Controlled in the office today. Continue losartan, HCTZ, amlodipine. CMP reviewed.

## 2019-10-23 NOTE — Assessment & Plan Note (Signed)
LDL at goal, continue Crestor.  Continue same.

## 2019-10-23 NOTE — Assessment & Plan Note (Signed)
Doing well on paroxetine 20 mg, continue same.

## 2019-10-23 NOTE — Assessment & Plan Note (Signed)
Shingrix due, but he declines today. Influenza vaccination provided today. PSA UTD. Colonoscopy overdue, declines again today despite recommendations. Declines Cologuard.   Discussed the importance of a healthy diet and regular exercise in order for weight loss, and to reduce the risk of any potential medical problems.  Exam today stable. Labs reviewed.

## 2019-10-23 NOTE — Assessment & Plan Note (Signed)
Compliant to CPAP machine, follows with pulmonology. 

## 2019-10-23 NOTE — Assessment & Plan Note (Signed)
A1C increased to 6.9 on recent labs, would prefer him to be lower.   He is very motivated to improve his diet and start exercising for weight loss, has already lost 8 pounds.   Continue Metformin daily. Eye exam UTD. Managed on statin and ARB. Pneumonia vaccination UTD.  Follow up in 6 months.

## 2019-10-23 NOTE — Assessment & Plan Note (Signed)
No longer using sildenafil.

## 2019-10-25 ENCOUNTER — Other Ambulatory Visit: Payer: Self-pay | Admitting: Primary Care

## 2019-10-25 DIAGNOSIS — F411 Generalized anxiety disorder: Secondary | ICD-10-CM

## 2019-10-25 DIAGNOSIS — E785 Hyperlipidemia, unspecified: Secondary | ICD-10-CM

## 2019-11-21 ENCOUNTER — Telehealth: Payer: Self-pay | Admitting: Primary Care

## 2019-11-21 DIAGNOSIS — I10 Essential (primary) hypertension: Secondary | ICD-10-CM

## 2019-11-23 NOTE — Telephone Encounter (Signed)
Pt checking on rx Pt is out of meds

## 2019-11-23 NOTE — Telephone Encounter (Signed)
Called and let pt know has been called in

## 2019-12-30 ENCOUNTER — Other Ambulatory Visit: Payer: Self-pay | Admitting: Primary Care

## 2019-12-30 DIAGNOSIS — I10 Essential (primary) hypertension: Secondary | ICD-10-CM

## 2020-04-28 ENCOUNTER — Encounter: Payer: Self-pay | Admitting: Primary Care

## 2020-04-28 NOTE — Patient Instructions (Addendum)
Start exercising. You should be getting 150 minutes of moderate intensity exercise weekly.  It is important that you improve your diet. Please limit carbohydrates in the form of white bread, rice, pasta, sweets, fast food, fried food, sugary drinks, etc. Increase your consumption of fresh fruits and vegetables, whole grains, lean protein.  Ensure you are consuming 64 ounces of water daily.  Please schedule a follow up appointment in 6 months for your annual physical.   It was a pleasure to see you today!    Recombinant Zoster (Shingles) Vaccine: What You Need to Know 1. Why get vaccinated? Recombinant zoster (shingles) vaccine can prevent shingles. Shingles (also called herpes zoster, or just zoster) is a painful skin rash, usually with blisters. In addition to the rash, shingles can cause fever, headache, chills, or upset stomach. More rarely, shingles can lead to pneumonia, hearing problems, blindness, brain inflammation (encephalitis), or death. The most common complication of shingles is long-term nerve pain called postherpetic neuralgia (PHN). PHN occurs in the areas where the shingles rash was, even after the rash clears up. It can last for months or years after the rash goes away. The pain from PHN can be severe and debilitating. About 10 to 18% of people who get shingles will experience PHN. The risk of PHN increases with age. An older adult with shingles is more likely to develop PHN and have longer lasting and more severe pain than a younger person with shingles. Shingles is caused by the varicella zoster virus, the same virus that causes chickenpox. After you have chickenpox, the virus stays in your body and can cause shingles later in life. Shingles cannot be passed from one person to another, but the virus that causes shingles can spread and cause chickenpox in someone who had never had chickenpox or received chickenpox vaccine. 2. Recombinant shingles vaccine Recombinant shingles  vaccine provides strong protection against shingles. By preventing shingles, recombinant shingles vaccine also protects against PHN. Recombinant shingles vaccine is the preferred vaccine for the prevention of shingles. However, a different vaccine, live shingles vaccine, may be used in some circumstances. The recombinant shingles vaccine is recommended for adults 50 years and older without serious immune problems. It is given as a two-dose series. This vaccine is also recommended for people who have already gotten another type of shingles vaccine, the live shingles vaccine. There is no live virus in this vaccine. Shingles vaccine may be given at the same time as other vaccines. 3. Talk with your health care provider Tell your vaccine provider if the person getting the vaccine:  Has had an allergic reaction after a previous dose of recombinant shingles vaccine, or has any severe, life-threatening allergies.  Is pregnant or breastfeeding.  Is currently experiencing an episode of shingles. In some cases, your health care provider may decide to postpone shingles vaccination to a future visit. People with minor illnesses, such as a cold, may be vaccinated. People who are moderately or severely ill should usually wait until they recover before getting recombinant shingles vaccine. Your health care provider can give you more information. 4. Risks of a vaccine reaction  A sore arm with mild or moderate pain is very common after recombinant shingles vaccine, affecting about 80% of vaccinated people. Redness and swelling can also happen at the site of the injection.  Tiredness, muscle pain, headache, shivering, fever, stomach pain, and nausea happen after vaccination in more than half of people who receive recombinant shingles vaccine. In clinical trials, about 1 out of  6 people who got recombinant zoster vaccine experienced side effects that prevented them from doing regular activities. Symptoms usually  went away on their own in 2 to 3 days. You should still get the second dose of recombinant zoster vaccine even if you had one of these reactions after the first dose. People sometimes faint after medical procedures, including vaccination. Tell your provider if you feel dizzy or have vision changes or ringing in the ears. As with any medicine, there is a very remote chance of a vaccine causing a severe allergic reaction, other serious injury, or death. 5. What if there is a serious problem? An allergic reaction could occur after the vaccinated person leaves the clinic. If you see signs of a severe allergic reaction (hives, swelling of the face and throat, difficulty breathing, a fast heartbeat, dizziness, or weakness), call 9-1-1 and get the person to the nearest hospital. For other signs that concern you, call your health care provider. Adverse reactions should be reported to the Vaccine Adverse Event Reporting System (VAERS). Your health care provider will usually file this report, or you can do it yourself. Visit the VAERS website at www.vaers.LAgents.no or call (343)028-8045. VAERS is only for reporting reactions, and VAERS staff do not give medical advice. 6. How can I learn more?  Ask your health care provider.  Call your local or state health department.  Contact the Centers for Disease Control and Prevention (CDC): ? Call 434 223 6001 (1-800-CDC-INFO) or ? Visit CDC's website at PicCapture.uy Vaccine Information Statement Recombinant Zoster Vaccine (11/20/2017) This information is not intended to replace advice given to you by your health care provider. Make sure you discuss any questions you have with your health care provider. Document Revised: 09/11/2019 Document Reviewed: 09/11/2019 Elsevier Patient Education  2021 [employer].

## 2020-04-29 ENCOUNTER — Ambulatory Visit (INDEPENDENT_AMBULATORY_CARE_PROVIDER_SITE_OTHER): Payer: BC Managed Care – PPO | Admitting: Primary Care

## 2020-04-29 ENCOUNTER — Other Ambulatory Visit: Payer: Self-pay

## 2020-04-29 VITALS — BP 130/82 | HR 77 | Temp 98.6°F | Ht 73.0 in | Wt 306.0 lb

## 2020-04-29 DIAGNOSIS — Z23 Encounter for immunization: Secondary | ICD-10-CM

## 2020-04-29 DIAGNOSIS — I10 Essential (primary) hypertension: Secondary | ICD-10-CM

## 2020-04-29 DIAGNOSIS — E119 Type 2 diabetes mellitus without complications: Secondary | ICD-10-CM | POA: Diagnosis not present

## 2020-04-29 LAB — POCT GLYCOSYLATED HEMOGLOBIN (HGB A1C): Hemoglobin A1C: 6.9 % — AB (ref 4.0–5.6)

## 2020-04-29 MED ORDER — LOSARTAN POTASSIUM 100 MG PO TABS: 100.0000 mg | ORAL_TABLET | Freq: Every day | ORAL | 3 refills | Status: DC

## 2020-04-29 MED ORDER — METFORMIN HCL ER 500 MG PO TB24: 500.0000 mg | ORAL_TABLET | Freq: Every day | ORAL | 3 refills | Status: DC

## 2020-04-29 NOTE — Assessment & Plan Note (Signed)
A1C today the same as last visit at 6.9. Weight gain since last visit, encouraged weight loss through diet and exercise, he agrees.  Continue Metformin XR 500 mg daily. Foot exam today. Eye exam UTD. Managed on ARB and statin.  Pneumonia vaccine UTD.  Follow up in 6 months.

## 2020-04-29 NOTE — Progress Notes (Signed)
Subjective:    Patient ID: Jared Bradley, male    DOB: May 03, 1960, 60 y.o.   MRN: [id number]  HPI  Jared Bradley is a very pleasant 60 y.o. male who presents today for follow up of diabetes. He is also ready for his first Shingrix vaccine.   Current medications include: Metformin XR 500 mg daily  He is checking his blood glucose 0 times daily.   Last A1C: 6.9 in September 2021 Last Eye Exam: UTD Last Foot Exam: Due Pneumonia Vaccination: Completed 2019 ACE/ARB: Losartan  Statin: Crestor  BP Readings from Last 3 Encounters:  04/29/20 130/82  10/23/19 130/78  07/23/19 126/82   Wt Readings from Last 3 Encounters:  04/29/20 (!) 306 lb (138.8 kg)  10/23/19 298 lb (135.2 kg)  07/23/19 (!) 307 lb 12 oz (139.6 kg)      Review of Systems  Respiratory: Negative for shortness of breath.   Cardiovascular: Negative for chest pain.  Neurological: Negative for numbness.         Past Medical History:  Diagnosis Date  . Anxiety   . Cellulitis, leg 05/13/2019  . Hyperlipidemia   . Hypertension   . Poison ivy dermatitis 07/23/2019    Social History   Socioeconomic History  . Marital status: Married    Spouse name: Not on file  . Number of children: Not on file  . Years of education: Not on file  . Highest education level: Not on file  Occupational History  . Not on file  Tobacco Use  . Smoking status: Former Games developer  . Smokeless tobacco: Never Used  Substance and Sexual Activity  . Alcohol use: No  . Drug use: No  . Sexual activity: Not on file  Other Topics Concern  . Not on file  Social History Narrative   Married.   3 children.   Works as a Naval architect   Enjoys Contractor: Not on [employer] Insecurity: Not on file  Transportation Needs: Not on file  Physical Activity: Not on file  Stress: Not on file  Social Connections: Not on file  Intimate Partner Violence: Not on file     History reviewed. No pertinent surgical history.  Family History  Problem Relation Age of Onset  . Heart disease Father   . Hypertension Father   . Hyperlipidemia Father   . Heart disease Sister   . Diabetes Brother   . Hypertension Maternal Grandmother     Allergies  Allergen Reactions  . Hydrochlorothiazide W-Triamterene Nausea Only  . Rosuvastatin Other (See Comments)    Started after 5 years of treatment  . Lipitor [Atorvastatin] Other (See Comments)    Myalgias    Current Outpatient Medications on File Prior to Visit  Medication Sig Dispense Refill  . albuterol (VENTOLIN HFA) 108 (90 Base) MCG/ACT inhaler INHALE 1 PUFF INTO THE LUNGS EVERY 6 (SIX) HOURS AS NEEDED FOR WHEEZING OR SHORTNESS OF BREATH. 6.7 g 0  . amLODipine (NORVASC) 10 MG tablet TAKE 1 TABLET BY MOUTH EVERY DAY FOR BLOOD PRESSURE 90 tablet 3  . ASPIRIN 81 PO Take 81 mg by mouth daily.    . hydrochlorothiazide (HYDRODIURIL) 25 MG tablet TAKE 1 TABLET BY MOUTH EVERY DAY FOR BLOOD PRESSURE 90 tablet 3  . losartan (COZAAR) 100 MG tablet Take 1 tablet (100 mg total) by mouth daily. For blood pressure. 90 tablet 3  . metFORMIN (GLUCOPHAGE-XR) 500 MG 24 hr  tablet Take 1 tablet (500 mg total) by mouth daily with breakfast. For diabetes. 90 tablet 3  . PARoxetine (PAXIL) 20 MG tablet TAKE 1 TABLET BY MOUTH EVERY DAY 90 tablet 3  . rosuvastatin (CRESTOR) 10 MG tablet TAKE 1 TABLET BY MOUTH EVERY DAY IN THE EVENING FOR CHOLESTEROL 90 tablet 3   No current facility-administered medications on file prior to visit.    BP 130/82   Pulse 77   Temp 98.6 F (37 C) (Temporal)   Ht 6\' 1"  (1.854 m)   Wt (!) 306 lb (138.8 kg)   SpO2 95%   BMI 40.37 kg/m  Objective:   Physical Exam Cardiovascular:     Rate and Rhythm: Normal rate and regular rhythm.  Pulmonary:     Effort: Pulmonary effort is normal.     Breath sounds: Normal breath sounds. No wheezing or rales.  Musculoskeletal:     Cervical back: Neck supple.   Skin:    General: Skin is warm and dry.  Neurological:     Mental Status: He is alert and oriented to person, place, and time.           Assessment & Plan:      This visit occurred during the SARS-CoV-2 public health emergency.  Safety protocols were in place, including screening questions prior to the visit, additional usage of staff PPE, and extensive cleaning of exam room while observing appropriate contact time as indicated for disinfecting solutions.

## 2020-05-02 NOTE — Addendum Note (Signed)
Addended by: Donnamarie Poag on: 05/02/2020 08:53 AM   Modules accepted: Orders

## 2020-06-08 ENCOUNTER — Telehealth: Payer: Self-pay | Admitting: Pulmonary Disease

## 2020-06-08 NOTE — Telephone Encounter (Signed)
Spoke to patient, who is requesting a new cpap machine. His current machine has a message that motor has exceeded life.  Patient last seen 12/2018. Pending OV for 06/30/2020. Patient is aware that an appointment is needed prior to prescribing a new machine.  He voiced his understanding.  Nothing further needed at this time.

## 2020-06-10 LAB — HM DIABETES EYE EXAM

## 2020-06-30 ENCOUNTER — Encounter: Payer: Self-pay | Admitting: Adult Health

## 2020-06-30 ENCOUNTER — Other Ambulatory Visit: Payer: Self-pay

## 2020-06-30 ENCOUNTER — Ambulatory Visit (INDEPENDENT_AMBULATORY_CARE_PROVIDER_SITE_OTHER): Payer: BC Managed Care – PPO | Admitting: Adult Health

## 2020-06-30 VITALS — BP 150/90 | HR 79 | Temp 97.3°F | Ht 73.0 in | Wt 307.0 lb

## 2020-06-30 DIAGNOSIS — G4733 Obstructive sleep apnea (adult) (pediatric): Secondary | ICD-10-CM | POA: Diagnosis not present

## 2020-06-30 DIAGNOSIS — G4730 Sleep apnea, unspecified: Secondary | ICD-10-CM | POA: Diagnosis not present

## 2020-06-30 DIAGNOSIS — E6609 Other obesity due to excess calories: Secondary | ICD-10-CM | POA: Insufficient documentation

## 2020-06-30 NOTE — Progress Notes (Signed)
@Jared Bradley  ID: , male    DOB: 08/31/1960, 60 y.o.   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  Jared Bradley was last seen December 2020.  Jared Bradley has underlying obstructive sleep apnea.  Jared Bradley is on nocturnal CPAP.  Jared Bradley says that he is doing very well on CPAP.  He wears his CPAP every single night.  He says he cannot sleep without it.  Jared Bradley says that he usually gets in about 8 hours of sleep each night on his CPAP.  He has no significant daytime sleepiness.  Jared Bradley says his machine has come up with a message that the motor is near the end of life.  He wants to make sure he gets a new CPAP because he cannot be without wearing CPAP at bedtime.  Jared Bradley is a January 2021.  CPAP download shows excellent compliance with 97% usage.  Daily average usage at 8 hours each night.  Jared Bradley is on CPAP at 8 cm H2O.  AHI 0.8.  Minimum leaks. We discussed healthy weight loss. He denies sleep walking, sleep talking, bruxism, or nightmares.  There is no history of restless legs.  He denies sleep hallucinations, sleep paralysis, or cataplexy. Uses nasal pillows      Allergies  Allergen Reactions   Hydrochlorothiazide W-Triamterene Nausea Only   Rosuvastatin Other (See Comments)    Started after 58 years of treatment   Lipitor [Atorvastatin] Other (See Comments)    Myalgias    Immunization History  Administered Date(s) Administered   Influenza,inj,Quad PF,6+ Mos 09/26/2016, 12/16/2017, 10/17/2018, 10/23/2019   PFIZER(Purple Top)SARS-COV-2 Vaccination 09/14/2019, 10/12/2019   Pneumococcal Polysaccharide-23 08/09/2017   Td 06/22/2013   Zoster Recombinat (Shingrix) 04/29/2020    Past Medical History:  Diagnosis Date   Anxiety     Cellulitis, leg 05/13/2019   Hyperlipidemia    Hypertension    Poison ivy dermatitis 07/23/2019    Tobacco History: Social History   Tobacco Use  Smoking Status Former   Pack years: 0.00  Smokeless Tobacco Never   Counseling given: Not Answered   Outpatient Medications Prior to Visit  Medication Sig Dispense Refill   albuterol (VENTOLIN HFA) 108 (90 Base) MCG/ACT inhaler INHALE 1 PUFF INTO THE LUNGS EVERY 6 (SIX) HOURS AS NEEDED FOR WHEEZING OR SHORTNESS OF BREATH. 6.7 g 0   amLODipine (NORVASC) 10 MG tablet TAKE 1 TABLET BY MOUTH EVERY DAY FOR BLOOD PRESSURE 90 tablet 3   ASPIRIN 81 PO Take 81 mg by mouth daily.     hydrochlorothiazide (HYDRODIURIL) 25 MG tablet TAKE 1 TABLET BY MOUTH EVERY DAY FOR BLOOD PRESSURE 90 tablet 3   losartan (COZAAR) 100 MG tablet Take 1 tablet (100 mg total) by mouth daily. For blood pressure. 90 tablet 3   metFORMIN (GLUCOPHAGE-XR) 500 MG 24 hr tablet Take 1 tablet (500 mg total) by mouth daily with breakfast. For diabetes. 90 tablet 3   PARoxetine (PAXIL) 20 MG tablet TAKE 1 TABLET BY MOUTH EVERY DAY 90 tablet 3   rosuvastatin (CRESTOR) 10 MG tablet TAKE 1 TABLET BY MOUTH EVERY DAY IN THE EVENING FOR CHOLESTEROL 90 tablet 3   No facility-administered medications prior to visit.     Review of Systems:   Constitutional:   No  weight loss, night sweats,  Fevers, chills, fatigue, or  lassitude.  HEENT:   No headaches,  Difficulty swallowing,  Tooth/dental problems, or  Sore throat,                No sneezing, itching, ear ache, nasal congestion, post nasal drip,   CV:  No chest pain,  Orthopnea, PND, swelling in lower extremities, anasarca, dizziness, palpitations, syncope.   GI  No heartburn, indigestion, abdominal pain, nausea, vomiting, diarrhea, change in bowel habits, loss of appetite, bloody stools.   Resp: No shortness of breath with exertion or at rest.  No excess mucus, no productive cough,  No non-productive cough,  No coughing up of  blood.  No change in color of mucus.  No wheezing.  No chest wall deformity  Skin: no rash or lesions.  GU: no dysuria, change in color of urine, no urgency or frequency.  No flank pain, no hematuria   MS:  No joint pain or swelling.  No decreased range of motion.  No back pain.    Physical Exam  BP (!) 150/90 (BP Location: Left Arm, Jared Bradley Position: Sitting, Cuff Size: Normal)   Pulse 79   Temp (!) 97.3 F (36.3 C) (Temporal)   Ht 6\' 1"  (1.854 m)   Wt (!) 307 lb (139.3 kg)   SpO2 97%   BMI 40.50 kg/m   GEN: A/Ox3; pleasant , NAD, well nourished    HEENT:  [place]/AT,    NOSE-clear, THROAT-clear, no lesions, no postnasal drip or exudate noted. Class 3 MP airway   NECK:  Supple w/ fair ROM; no JVD; normal carotid impulses w/o bruits; no thyromegaly or nodules palpated; no lymphadenopathy.    RESP  Clear  P & A; w/o, wheezes/ rales/ or rhonchi. no accessory muscle use, no dullness to percussion  CARD:  RRR, no m/r/g, tr  peripheral edema, pulses intact, no cyanosis or clubbing.  GI:   Soft & nt; nml bowel sounds; no organomegaly or masses detected.   Musco: Warm bil, no deformities or joint swelling noted.   Neuro: alert, no focal deficits noted.    Skin: Warm, no lesions or rashes    Lab Results:  CBC   No results found for: PROBNP  Imaging: No results found.    No flowsheet data found.  No results found for: NITRICOXIDE      Assessment & Plan:   Sleep apnea OSA -excellent control and  compliance on nocturnal CPAP. Needs new CPAP machine , order to DME   Plan  Jared Bradley Instructions  Continue on CPAP At bedtime   Order for new machine .  Keep up good work  Do not drive if sleepy  Activity as tolerated. Work on healthy weight loss.  Follow up with Dr.  or Dwanna Goshert in [place] in 1 year and As needed        Morbid obesity (HCC) Healthy weight loss discussed      [place], NP 06/30/2020

## 2020-06-30 NOTE — Assessment & Plan Note (Signed)
Healthy weight loss discussed 

## 2020-06-30 NOTE — Assessment & Plan Note (Signed)
OSA -excellent control and  compliance on nocturnal CPAP. Needs new CPAP machine , order to DME   Plan  Patient Instructions  Continue on CPAP At bedtime   Order for new machine .  Keep up good work  Do not drive if sleepy  Activity as tolerated. Work on healthy weight loss.  Follow up with Dr. Craige Cotta  or Jobeth Pangilinan in [place] in 1 year and As needed

## 2020-06-30 NOTE — Patient Instructions (Signed)
Continue on CPAP At bedtime   Order for new machine .  Keep up good work  Do not drive if sleepy  Activity as tolerated. Work on healthy weight loss.  Follow up with Dr. Craige Cotta  or Libra Gatz in [place] in 1 year and As needed

## 2020-07-08 ENCOUNTER — Encounter: Payer: BC Managed Care – PPO | Admitting: Primary Care

## 2020-08-16 ENCOUNTER — Ambulatory Visit (INDEPENDENT_AMBULATORY_CARE_PROVIDER_SITE_OTHER): Payer: BC Managed Care – PPO | Admitting: Primary Care

## 2020-08-16 ENCOUNTER — Encounter: Payer: Self-pay | Admitting: Primary Care

## 2020-08-16 ENCOUNTER — Other Ambulatory Visit: Payer: Self-pay

## 2020-08-16 DIAGNOSIS — H6122 Impacted cerumen, left ear: Secondary | ICD-10-CM | POA: Diagnosis not present

## 2020-08-16 DIAGNOSIS — L03115 Cellulitis of right lower limb: Secondary | ICD-10-CM

## 2020-08-16 DIAGNOSIS — H6120 Impacted cerumen, unspecified ear: Secondary | ICD-10-CM

## 2020-08-16 HISTORY — DX: Impacted cerumen, unspecified ear: H61.20

## 2020-08-16 MED ORDER — DOXYCYCLINE HYCLATE 100 MG PO TABS: 100.0000 mg | ORAL_TABLET | Freq: Two times a day (BID) | ORAL | 0 refills | Status: DC

## 2020-08-16 NOTE — Progress Notes (Signed)
Subjective:    Patient ID: Jared Bradley, male    DOB: [birth year], 60 y.o.   MRN: [id number]  HPI  Jared Bradley is a very pleasant 60 y.o. male with a history of hypertension, sleep apnea, type 2 diabetes, hyperlipidemia who presents today to discuss skin wound and left ear fullness.   The wound is located to the right knee for which he first noticed about one week ago. Since then he's noticed redness, pain, swelling, yellow/clear bandage.   He denies fevers, injury/trauma. This same wound appeared about one year ago, same location and symptoms, treated by Dr. Alphonsus Sias for cellulitis with Doxycycline, there was a concern for MRSA. He's not been tested.  This resolved after antibiotic treatment.   Recently he's been intermittently taking doses of Amoxicillin 500 mg for two days, resumed and took two doses a few days ago.   Left ear fullness for "a while", is questioning whether his ears are impacted with cerumen. He denies pain. He does not use q-tips.    Review of Systems  Constitutional:  Negative for fever.  HENT:         Left ear fullness  Skin:  Positive for color change and wound.        Past Medical History:  Diagnosis Date   Anxiety    Cellulitis, leg 05/13/2019   Hyperlipidemia    Hypertension    Poison ivy dermatitis 07/23/2019    Social History   Socioeconomic History   Marital status: Married    Spouse name: Not on file   Number of children: Not on file   Years of education: Not on file   Highest education level: Not on file  Occupational History   Not on file  Tobacco Use   Smoking status: Former   Smokeless tobacco: Never  Substance and Sexual Activity   Alcohol use: No   Drug use: No   Sexual activity: Not on file  Other Topics Concern   Not on file  Social History Narrative   Married.   3 children.   Works as a Naval architect   Enjoys Contractor: Not on [employer]  Insecurity: Not on file  Transportation Needs: Not on file  Physical Activity: Not on file  Stress: Not on file  Social Connections: Not on file  Intimate Partner Violence: Not on file    History reviewed. No pertinent surgical history.  Family History  Problem Relation Age of Onset   Heart disease Father    Hypertension Father    Hyperlipidemia Father    Heart disease Sister    Diabetes Brother    Hypertension Maternal Grandmother     Allergies  Allergen Reactions   Hydrochlorothiazide W-Triamterene Nausea Only   Rosuvastatin Other (See Comments)    Started after 5 years of treatment   Lipitor [Atorvastatin] Other (See Comments)    Myalgias    Current Outpatient Medications on File Prior to Visit  Medication Sig Dispense Refill   albuterol (VENTOLIN HFA) 108 (90 Base) MCG/ACT inhaler INHALE 1 PUFF INTO THE LUNGS EVERY 6 (SIX) HOURS AS NEEDED FOR WHEEZING OR SHORTNESS OF BREATH. 6.7 g 0   amLODipine (NORVASC) 10 MG tablet TAKE 1 TABLET BY MOUTH EVERY DAY FOR BLOOD PRESSURE 90 tablet 3   ASPIRIN 81 PO Take 81 mg by mouth daily.     hydrochlorothiazide (HYDRODIURIL) 25 MG tablet TAKE 1 TABLET BY MOUTH EVERY  DAY FOR BLOOD PRESSURE 90 tablet 3   losartan (COZAAR) 100 MG tablet Take 1 tablet (100 mg total) by mouth daily. For blood pressure. 90 tablet 3   metFORMIN (GLUCOPHAGE-XR) 500 MG 24 hr tablet Take 1 tablet (500 mg total) by mouth daily with breakfast. For diabetes. 90 tablet 3   PARoxetine (PAXIL) 20 MG tablet TAKE 1 TABLET BY MOUTH EVERY DAY 90 tablet 3   rosuvastatin (CRESTOR) 10 MG tablet TAKE 1 TABLET BY MOUTH EVERY DAY IN THE EVENING FOR CHOLESTEROL 90 tablet 3   No current facility-administered medications on file prior to visit.    BP 132/78   Pulse 81   Temp 98.2 F (36.8 C) (Temporal)   Ht 6\' 1"  (1.854 m)   Wt (!) 306 lb (138.8 kg)   SpO2 97%   BMI 40.37 kg/m  Objective:   Physical Exam Constitutional:      Appearance: He is not ill-appearing.   HENT:     Right Ear: Hearing, tympanic membrane, ear canal and external ear normal.     Left Ear: There is impacted cerumen.  Pulmonary:     Effort: Pulmonary effort is normal.  Skin:    Findings: Erythema present.     Comments: 0.5 cm circular wound to right anterior knee, more medially located on patella.  Moderate surrounding erythema, warmth, tenderness. Clear drainage.   Neurological:     Mental Status: He is alert.          Assessment & Plan:      This visit occurred during the SARS-CoV-2 public health emergency.  Safety protocols were in place, including screening questions prior to the visit, additional usage of staff PPE, and extensive cleaning of exam room while observing appropriate contact time as indicated for disinfecting solutions.

## 2020-08-16 NOTE — Assessment & Plan Note (Signed)
Recurrent episode, last one occurring in April 2021, resolved with Doxycycline.  Will swab wound today for culture.  Discussed to stop Amoxil. Will treat with Doxycycline to cover for MRSA.  He will update if no improvement.

## 2020-08-16 NOTE — Assessment & Plan Note (Signed)
With ear fullness to left side.  Patient consents to irrigation. Left ear irrigated. Patient tolerated well. TM and canal without abnormality post irrigation.

## 2020-08-16 NOTE — Patient Instructions (Signed)
Start Doxycycline antibiotic for the infection. Take 1 tablet by mouth twice daily for 7 days.  Please notify me if no improvement in 3-4 days.  It was a pleasure to see you today!

## 2020-08-19 LAB — WOUND CULTURE
MICRO NUMBER:: 12164018
SPECIMEN QUALITY:: ADEQUATE

## 2020-08-25 ENCOUNTER — Telehealth: Payer: Self-pay

## 2020-08-25 DIAGNOSIS — L03115 Cellulitis of right lower limb: Secondary | ICD-10-CM

## 2020-08-25 MED ORDER — DOXYCYCLINE HYCLATE 100 MG PO TABS: 100.0000 mg | ORAL_TABLET | Freq: Two times a day (BID) | ORAL | 0 refills | Status: DC

## 2020-08-25 NOTE — Telephone Encounter (Signed)
Pt will send pic through mychart, I advise him how to attach a pic

## 2020-08-25 NOTE — Telephone Encounter (Signed)
Left v/m requesting pt to call back [facility].sending note to Allayne Gitelman NP and Joellen CMA. Pt last seen 08/16/20 with cellulitis.

## 2020-08-25 NOTE — Telephone Encounter (Signed)
Will you have him send a picture of his knee through my chart so that I can take a look?

## 2020-08-25 NOTE — Telephone Encounter (Signed)
[facility] Night - Client TELEPHONE ADVICE RECORD [facility] Patient Name: Jared Bradley NGER Gender: Male DOB: 1960-08-11 Age: 60 Y 11 M 13 D Return Phone Number: [phone] (Primary) Address: City/ State/ Zip: [place] [address] Client [facility] Night - Client Client Site  [facility] - Night Physician Vernona Rieger - NP Contact Type Call Who Is Calling Patient / Member / Family / Caregiver Call Type Triage / Clinical Relationship To Patient Self Return Phone Number [phone] (Primary) Chief Complaint Insect Bite Reason for Call Symptomatic / Request for Health Information Initial Comment Caller states he has been taking antibiotics, and today was the last day of his antibiotics and he still feels bad. Caller states he has a insect bite on his knee. Translation No Nurse Assessment Nurse: Chilton Si, RN, [place] Date/Time (Eastern Time): 08/24/2020 7:02:57 PM Confirm and document reason for call. If symptomatic, describe symptoms. ---Caller states he has been taking antibiotics, and today was the last day of his antibiotics, states the area is not currently draining. Caller states he has a insect bite on his knee. Does the patient have any new or worsening symptoms? ---Yes Will a triage be completed? ---Yes Related visit to physician within the last 2 weeks? ---Yes Does the PT have any chronic conditions? (i.e. diabetes, asthma, this includes High risk factors for pregnancy, etc.) ---Yes List chronic conditions. ---diabetes, hypertension, hyperlidemia Is this a behavioral health or substance abuse call? ---No Guidelines Guideline Title Affirmed Question Affirmed Notes Nurse Date/Time (Eastern Time) Infection on Antibiotic Follow-up Call [1] Finished taking antibiotics AND [2] symptoms are BETTER but [3] not completely gone Chilton Si, Charity fundraiser, [place] 08/24/2020 7:05:04 PM PLEASE NOTE: All  timestamps contained within this report are represented as [place] Standard Time. CONFIDENTIALTY NOTICE: This fax transmission is intended only for the addressee. It contains information that is legally privileged, confidential or otherwise protected from use or disclosure. If you are not the intended recipient, you are strictly prohibited from reviewing, disclosing, copying using or disseminating any of this information or taking any action in reliance on or regarding this information. If you have received this fax in error, please notify us immediately by telephone so that we can arrange for its return to [place]. Phone: [phone], Toll-Free: [phone], Fax: [phone] Page: 2 of 2 Call Id: [id number] Disp. Time Lamount Cohen Time) Disposition Final User 08/24/2020 6:25:59 PM Send to Clinical Marita Kansas, RN, [place] Fail 08/24/2020 6:26:04 PM Send to Clinical Marita [place], RN, Synetta Fail 08/24/2020 7:09:07 PM SEE PCP WITHIN 3 DAYS Yes Chilton Si, RN, Matthew Saras Disagree/Comply Comply Caller Understands Yes PreDisposition Call Doctor Care Advice Given Per Guideline SEE PCP WITHIN 3 DAYS: * You need to be seen within 2 or 3 days. * They are over-the-counter (OTC) drugs that help treat both fever and pain. You can buy them at the drugstore. CALL BACK IF: * You become worse CARE ADVICE given per Infection on Antibiotics Follow-Up Call (Adult) guideline. * PCP VISIT: Call your doctor (or NP/PA) during regular office hours and make an appointment. A clinic or urgent care center are good places to go for care if your doctor's office is closed or you can't get an appointment. NOTE: If office will be open tomorrow, tell caller to call then, not in 3 days. Referrals REFERRED TO PCP OFFICE

## 2020-08-25 NOTE — Telephone Encounter (Signed)
Called patient wanted you to now it is much better but has not gone away.  It is still tender to touch and red. Was not sure if he needed to take more antibiotics.

## 2020-10-21 ENCOUNTER — Other Ambulatory Visit: Payer: Self-pay | Admitting: Primary Care

## 2020-10-21 ENCOUNTER — Telehealth: Payer: Self-pay | Admitting: Primary Care

## 2020-10-21 DIAGNOSIS — E785 Hyperlipidemia, unspecified: Secondary | ICD-10-CM

## 2020-10-21 DIAGNOSIS — I10 Essential (primary) hypertension: Secondary | ICD-10-CM

## 2020-10-21 NOTE — Telephone Encounter (Signed)
Letter created and printed.  Placed in Jared Bradley's inbox, ready for pickup.

## 2020-10-21 NOTE — Telephone Encounter (Signed)
Called patient was no longer needed he was able to show him his my chart results. Will call if any further action needed.

## 2020-10-21 NOTE — Telephone Encounter (Signed)
Pt called stating that he needs a copy of his A1C and that he is in compliance of taken medication. Pt would like it faxed to [phone].

## 2020-10-24 ENCOUNTER — Other Ambulatory Visit: Payer: Self-pay | Admitting: Primary Care

## 2020-10-24 DIAGNOSIS — F411 Generalized anxiety disorder: Secondary | ICD-10-CM

## 2020-10-28 ENCOUNTER — Encounter: Payer: BC Managed Care – PPO | Admitting: Primary Care

## 2020-11-29 ENCOUNTER — Encounter: Payer: BC Managed Care – PPO | Admitting: Primary Care

## 2020-12-09 ENCOUNTER — Encounter: Payer: Self-pay | Admitting: Primary Care

## 2020-12-09 ENCOUNTER — Other Ambulatory Visit: Payer: Self-pay

## 2020-12-09 ENCOUNTER — Ambulatory Visit (INDEPENDENT_AMBULATORY_CARE_PROVIDER_SITE_OTHER): Payer: BC Managed Care – PPO | Admitting: Primary Care

## 2020-12-09 VITALS — BP 130/62 | HR 68 | Temp 97.5°F | Ht 73.0 in | Wt 310.0 lb

## 2020-12-09 DIAGNOSIS — Z114 Encounter for screening for human immunodeficiency virus [HIV]: Secondary | ICD-10-CM

## 2020-12-09 DIAGNOSIS — F411 Generalized anxiety disorder: Secondary | ICD-10-CM

## 2020-12-09 DIAGNOSIS — Z23 Encounter for immunization: Secondary | ICD-10-CM | POA: Diagnosis not present

## 2020-12-09 DIAGNOSIS — E1165 Type 2 diabetes mellitus with hyperglycemia: Secondary | ICD-10-CM | POA: Diagnosis not present

## 2020-12-09 DIAGNOSIS — Z125 Encounter for screening for malignant neoplasm of prostate: Secondary | ICD-10-CM

## 2020-12-09 DIAGNOSIS — I10 Essential (primary) hypertension: Secondary | ICD-10-CM | POA: Diagnosis not present

## 2020-12-09 DIAGNOSIS — G4730 Sleep apnea, unspecified: Secondary | ICD-10-CM

## 2020-12-09 DIAGNOSIS — Z0000 Encounter for general adult medical examination without abnormal findings: Secondary | ICD-10-CM | POA: Diagnosis not present

## 2020-12-09 DIAGNOSIS — E785 Hyperlipidemia, unspecified: Secondary | ICD-10-CM

## 2020-12-09 LAB — COMPREHENSIVE METABOLIC PANEL
ALT: 48 U/L (ref 0–53)
AST: 57 U/L — ABNORMAL HIGH (ref 0–37)
Albumin: 4.6 g/dL (ref 3.5–5.2)
Alkaline Phosphatase: 91 U/L (ref 39–117)
BUN: 15 mg/dL (ref 6–23)
CO2: 29 mEq/L (ref 19–32)
Calcium: 10 mg/dL (ref 8.4–10.5)
Chloride: 101 mEq/L (ref 96–112)
Creatinine, Ser: 0.84 mg/dL (ref 0.40–1.50)
GFR: 96.31 mL/min (ref >60.00)
Glucose, Bld: 179 mg/dL — ABNORMAL HIGH (ref 70–99)
Potassium: 5.1 mEq/L (ref 3.5–5.1)
Sodium: 136 mEq/L (ref 135–145)
Total Bilirubin: 0.6 mg/dL (ref 0.2–1.2)
Total Protein: 7.2 g/dL (ref 6.0–8.3)

## 2020-12-09 LAB — LIPID PANEL
Cholesterol: 159 mg/dL (ref 0–200)
HDL: 35.7 mg/dL — ABNORMAL LOW (ref >39.00)
LDL Cholesterol: 94 mg/dL (ref 0–99)
NonHDL: 123.13
Total CHOL/HDL Ratio: 4
Triglycerides: 144 mg/dL (ref 0.0–149.0)
VLDL: 28.8 mg/dL (ref 0.0–40.0)

## 2020-12-09 LAB — HEMOGLOBIN A1C: Hgb A1c MFr Bld: 8.8 % — ABNORMAL HIGH (ref 4.6–6.5)

## 2020-12-09 LAB — PSA: PSA: 0.69 ng/mL (ref 0.10–4.00)

## 2020-12-09 NOTE — Assessment & Plan Note (Signed)
Compliant to CPAP nightly, following with pulmonology. 

## 2020-12-09 NOTE — Assessment & Plan Note (Signed)
Controlled in the office today. Continue amlodipine 10 mg, HCTZ 25 mg, losartan 100 mg.  CMP pending.

## 2020-12-09 NOTE — Assessment & Plan Note (Signed)
Compliant to Metformin XR 500 mg daily for the most part. Encouraged daily compliance.   Managed on statin and ARB. Eye exam UTD. Pneumonia vaccine UTD.  Foot exam UTD.   Follow up in 3-6 months.

## 2020-12-09 NOTE — Assessment & Plan Note (Signed)
Discussed the importance of a healthy diet and regular exercise in order for weight loss, and to reduce the risk of further co-morbidity.  Strongly advised he work on his diet, less take out and fast food. Start exercising.

## 2020-12-09 NOTE — Assessment & Plan Note (Signed)
Compliant to rosuvastatin 10 mg, continue same. Repeat lipid panel pending.  

## 2020-12-09 NOTE — Patient Instructions (Addendum)
Stop by the lab prior to leaving today. I will notify you of your results once received.  ° °It was a pleasure to see you today! ° °Preventive Care 40-58 Years Old, Male °Preventive care refers to lifestyle choices and visits with your health care provider that can promote health and wellness. Preventive care visits are also called wellness exams. °What can I expect for my preventive care visit? °Counseling °During your preventive care visit, your health care provider may ask about your: °Medical history, including: °Past medical problems. °Family medical history. °Current health, including: °Emotional well-being. °Home life and relationship well-being. °Sexual activity. °Lifestyle, including: °Alcohol, nicotine or tobacco, and drug use. °Access to firearms. °Diet, exercise, and sleep habits. °Safety issues such as seatbelt and bike helmet use. °Sunscreen use. °Work and work environment. °Physical exam °Your health care provider will check your: °Height and weight. These may be used to calculate your BMI (body mass index). BMI is a measurement that tells if you are at a healthy weight. °Waist circumference. This measures the distance around your waistline. This measurement also tells if you are at a healthy weight and may help predict your risk of certain diseases, such as type 2 diabetes and high blood pressure. °Heart rate and blood pressure. °Body temperature. °Skin for abnormal spots. °What immunizations do I need? °Vaccines are usually given at various ages, according to a schedule. Your health care provider will recommend vaccines for you based on your age, medical history, and lifestyle or other factors, such as travel or where you work. °What tests do I need? °Screening °Your health care provider may recommend screening tests for certain conditions. This may include: °Lipid and cholesterol levels. °Diabetes screening. This is done by checking your blood sugar (glucose) after you have not eaten for a while  (fasting). °Hepatitis B test. °Hepatitis C test. °HIV (human immunodeficiency virus) test. °STI (sexually transmitted infection) testing, if you are at risk. °Lung cancer screening. °Prostate cancer screening. °Colorectal cancer screening. °Talk with your health care provider about your test results, treatment options, and if necessary, the need for more tests. °Follow these instructions at home: °Eating and drinking ° °Eat a diet that includes fresh fruits and vegetables, whole grains, lean protein, and low-fat dairy products. °Take vitamin and mineral supplements as recommended by your health care provider. °Do not drink alcohol if your health care provider tells you not to drink. °If you drink alcohol: °Limit how much you have to 0-2 drinks a day. °Know how much alcohol is in your drink. In the [place] one drink equals one 12 oz bottle of beer (355 mL), one 5 oz glass of wine (148 mL), or one 1½ oz glass of hard liquor (44 mL). °Lifestyle °Brush your teeth every morning and night with fluoride toothpaste. Floss one time each day. °Exercise for at least 30 minutes 5 or more days each week. °Do not use any products that contain nicotine or tobacco. These products include cigarettes, chewing tobacco, and vaping devices, such as e-cigarettes. If you need help quitting, ask your health care provider. °Do not use drugs. °If you are sexually active, practice safe sex. Use a condom or other form of protection to prevent STIs. °Take aspirin only as told by your health care provider. Make sure that you understand how much to take and what form to take. Work with your health care provider to find out whether it is safe and beneficial for you to take aspirin daily. °Find healthy ways to manage stress,   such as: °Meditation, yoga, or listening to music. °Journaling. °Talking to a trusted person. °Spending time with friends and family. °Minimize exposure to UV radiation to reduce your risk of skin cancer. °Safety °Always wear  your seat belt while driving or riding in a vehicle. °Do not drive: °If you have been drinking alcohol. Do not ride with someone who has been drinking. °When you are tired or distracted. °While texting. °If you have been using any mind-altering substances or drugs. °Wear a helmet and other protective equipment during sports activities. °If you have firearms in your house, make sure you follow all gun safety procedures. °What's next? °Go to your health care provider once a year for an annual wellness visit. °Ask your health care provider how often you should have your eyes and teeth checked. °Stay up to date on all vaccines. °This information is not intended to replace advice given to you by your health care provider. Make sure you discuss any questions you have with your health care provider. °Document Revised: 07/06/2020 Document Reviewed: 07/06/2020 °Elsevier Patient Education © 2022 [employer] ° °

## 2020-12-09 NOTE — Assessment & Plan Note (Signed)
Second Shingrix due and provided today. Other vaccines UTD.   Discussed importance of daily compliance to medications as he forgets at least twice weekly.   Declines colonoscopy despite recommendations.   PSA due and pending.  Discussed the importance of a healthy diet and regular exercise in order for weight loss, and to reduce the risk of further co-morbidity.  Exam today stable.  Labs pending.

## 2020-12-09 NOTE — Progress Notes (Signed)
Subjective:    Patient ID: Jared Bradley, male    DOB: 07/09/1960, 60 y.o.   MRN: [id number]  HPI  Jared Bradley is a very pleasant 60 y.o. male who presents today for complete physical and follow up of chronic conditions.   Immunizations: -Tetanus: 2015 -Influenza: Completed this season -Covid-19: 2 vaccines -Shingles: 1 dose, due for second dose. -Pneumonia:  Prevnar in 2019  Diet: Poor diet. Mostly take out and fast food.  Exercise: No regular exercise.  Eye exam: Completes annually  Dental exam: Completes semi-annually   Colonoscopy: Never completed, declines again today PSA: Due  BP Readings from Last 3 Encounters:  12/09/20 130/62  08/16/20 132/78  06/30/20 (!) 150/90     Review of Systems  Constitutional:  Negative for unexpected weight change.  HENT:  Negative for rhinorrhea.   Respiratory:  Negative for cough and shortness of breath.   Cardiovascular:  Negative for chest pain.  Gastrointestinal:  Negative for constipation and diarrhea.  Genitourinary:  Negative for difficulty urinating.  Musculoskeletal:  Negative for arthralgias and myalgias.  Skin:  Negative for rash.  Allergic/Immunologic: Negative for environmental allergies.  Neurological:  Negative for dizziness and headaches.  Psychiatric/Behavioral:  The patient is not nervous/anxious.         Past Medical History:  Diagnosis Date   Anxiety    Cellulitis, leg 05/13/2019   Hyperlipidemia    Hypertension    Poison ivy dermatitis 07/23/2019    Social History   Socioeconomic History   Marital status: Married    Spouse name: Not on file   Number of children: Not on file   Years of education: Not on file   Highest education level: Not on file  Occupational History   Not on file  Tobacco Use   Smoking status: Former   Smokeless tobacco: Never  Substance and Sexual Activity   Alcohol use: No   Drug use: No   Sexual activity: Not on file  Other Topics Concern   Not on  file  Social History Narrative   Married.   3 children.   Works as a Naval architect   Enjoys Contractor: Not on [employer] Insecurity: Not on file  Transportation Needs: Not on file  Physical Activity: Not on file  Stress: Not on file  Social Connections: Not on file  Intimate Partner Violence: Not on file    History reviewed. No pertinent surgical history.  Family History  Problem Relation Age of Onset   Heart disease Father    Hypertension Father    Hyperlipidemia Father    Heart disease Sister    Diabetes Brother    Hypertension Maternal Grandmother     Allergies  Allergen Reactions   Hydrochlorothiazide W-Triamterene Nausea Only   Rosuvastatin Other (See Comments)    Started after 5 years of treatment   Lipitor [Atorvastatin] Other (See Comments)    Myalgias    Current Outpatient Medications on File Prior to Visit  Medication Sig Dispense Refill   amLODipine (NORVASC) 10 MG tablet TAKE 1 TABLET BY MOUTH EVERY DAY FOR BLOOD PRESSURE 90 tablet 0   ASPIRIN 81 PO Take 81 mg by mouth daily.     hydrochlorothiazide (HYDRODIURIL) 25 MG tablet TAKE 1 TABLET BY MOUTH EVERY DAY FOR BLOOD PRESSURE 90 tablet 3   losartan (COZAAR) 100 MG tablet Take 1 tablet (100 mg total) by mouth daily. For blood pressure.  90 tablet 3   metFORMIN (GLUCOPHAGE-XR) 500 MG 24 hr tablet Take 1 tablet (500 mg total) by mouth daily with breakfast. For diabetes. 90 tablet 3   PARoxetine (PAXIL) 20 MG tablet Take 1 tablet (20 mg total) by mouth daily. For anxiety. Office visit required for further refills. 90 tablet 0   rosuvastatin (CRESTOR) 10 MG tablet TAKE 1 TABLET BY MOUTH EVERY DAY IN THE EVENING FOR CHOLESTEROL 90 tablet 0   No current facility-administered medications on file prior to visit.    BP 130/62   Pulse 68   Temp (!) 97.5 F (36.4 C) (Temporal)   Ht 6\' 1"  (1.854 m)   Wt (!) 310 lb (140.6 kg)   SpO2 97%   BMI 40.90 kg/m   Objective:   Physical Exam HENT:     Right Ear: Tympanic membrane and ear canal normal.     Left Ear: Tympanic membrane and ear canal normal.     Nose: Nose normal.     Right Sinus: No maxillary sinus tenderness or frontal sinus tenderness.     Left Sinus: No maxillary sinus tenderness or frontal sinus tenderness.  Eyes:     Conjunctiva/sclera: Conjunctivae normal.  Neck:     Thyroid: No thyromegaly.     Vascular: No carotid bruit.  Cardiovascular:     Rate and Rhythm: Normal rate and regular rhythm.     Heart sounds: Normal heart sounds.  Pulmonary:     Effort: Pulmonary effort is normal.     Breath sounds: Normal breath sounds. No wheezing or rales.  Abdominal:     General: Bowel sounds are normal.     Palpations: Abdomen is soft.     Tenderness: There is no abdominal tenderness.  Musculoskeletal:        General: Normal range of motion.     Cervical back: Neck supple.  Skin:    General: Skin is warm and dry.  Neurological:     Mental Status: He is alert and oriented to person, place, and time.     Cranial Nerves: No cranial nerve deficit.     Deep Tendon Reflexes: Reflexes are normal and symmetric.  Psychiatric:        Mood and Affect: Mood normal.          Assessment & Plan:      This visit occurred during the SARS-CoV-2 public health emergency.  Safety protocols were in place, including screening questions prior to the visit, additional usage of staff PPE, and extensive cleaning of exam room while observing appropriate contact time as indicated for disinfecting solutions.

## 2020-12-09 NOTE — Assessment & Plan Note (Signed)
Stable and endorses to be doing well on paroxetine 20 mg.   Offered support as his son has been a heroine addict for the last 10 years.   Continue paroxetine 20 mg.

## 2020-12-12 LAB — HIV ANTIBODY (ROUTINE TESTING W REFLEX): HIV 1&2 Ab, 4th Generation: NONREACTIVE

## 2020-12-27 ENCOUNTER — Encounter: Payer: Self-pay | Admitting: Primary Care

## 2021-01-06 ENCOUNTER — Other Ambulatory Visit: Payer: Self-pay

## 2021-01-06 ENCOUNTER — Telehealth (INDEPENDENT_AMBULATORY_CARE_PROVIDER_SITE_OTHER): Payer: BC Managed Care – PPO | Admitting: Primary Care

## 2021-01-06 ENCOUNTER — Encounter: Payer: Self-pay | Admitting: Primary Care

## 2021-01-06 VITALS — BP 149/89 | HR 72 | Ht 73.0 in | Wt 292.0 lb

## 2021-01-06 DIAGNOSIS — J069 Acute upper respiratory infection, unspecified: Secondary | ICD-10-CM | POA: Diagnosis not present

## 2021-01-06 DIAGNOSIS — E1165 Type 2 diabetes mellitus with hyperglycemia: Secondary | ICD-10-CM

## 2021-01-06 MED ORDER — BENZONATATE 200 MG PO CAPS: 200.0000 mg | ORAL_CAPSULE | Freq: Three times a day (TID) | ORAL | 0 refills | Status: DC | PRN

## 2021-01-06 NOTE — Assessment & Plan Note (Signed)
Worse with increase in A1C to 8.8 from 6.9.  Commended him on his weight loss efforts, encouraged to continue!  Continue Metformin XR 500 mg daily for now. We will plan to meet back up in mid February 2023 for repeat A1C and follow up. He was advised to schedule a visit for then.

## 2021-01-06 NOTE — Assessment & Plan Note (Signed)
Symptoms and presentation today represent viral etiology at this point.  Will treat conservatively.   Rx for Tessalon Perles 200 mg sent to pharmacy to take TID PRN.  He will update Monday next week, if no better then consider antibiotic treatment. Will provide work note. Discussed other OTC treatment.

## 2021-01-06 NOTE — Patient Instructions (Signed)
You may take Benzonatate capsules for cough. Take 1 capsule by mouth three times daily as needed for cough.  Please update me Monday next week.  It was a pleasure to see you today!

## 2021-01-06 NOTE — Progress Notes (Signed)
Patient ID: Jared Bradley, male    DOB: 03/09/1960, 60 y.o.   MRN: [id number]  Virtual visit completed through [employer], a video enabled telemedicine application. Due to national recommendations of social distancing due to COVID-19, a virtual visit is felt to be most appropriate for this patient at this time. Reviewed limitations, risks, security and privacy concerns of performing a virtual visit and the availability of in person appointments. I also reviewed that there may be a patient responsible charge related to this service. The patient agreed to proceed.   Patient location: home Provider location: [place] at [facility], office Persons participating in this virtual visit: patient, provider   If any vitals were documented, they were collected by patient at home unless specified below.    BP (!) 149/89    Pulse 72    Ht 6\' 1"  (1.854 m)    Wt 292 lb (132.5 kg)    BMI 38.52 kg/m    CC: Cough Subjective:   HPI: Jared Bradley is a 60 y.o. male with a history of hypertension, sleep apnea, type 2 diabetes, hyperlipidemia presenting on 01/06/2021 for cough and follow up of diabetes.  He also reports scratchy throat, fatigue, rhinorrhea. Cough is productive and cough is worse with exertion, will also experience coughing spells. He denies fever, headaches, head pressure. No one else is sick in the home. He has not tested for Covid-19 of flu.   Symptoms began 4-5 days ago. He's been taking Muinex, Nyquil, Dayquil without improvement. His most bothersome symptom is his cough. Overall feels okay.   He is needing a work note for early next week.  Evaluated for CPE in mid November 2022, A1C had increased from 6.9 in April 2022 to 8.8. I sent him a message via MyChart in November with recommendations to increase metformin to 2 tablets daily. Today he endorses a 20 pound weight loss since our visit as he's cut out all sugar and sodas. He does not want to increase metformin but wants  to continue with lifestyle changes and meet back up in mid February 2023 for recheck.      Relevant past medical, surgical, family and social history reviewed and updated as indicated. Interim medical history since our last visit reviewed. Allergies and medications reviewed and updated. Outpatient Medications Prior to Visit  Medication Sig Dispense Refill   amLODipine (NORVASC) 10 MG tablet TAKE 1 TABLET BY MOUTH EVERY DAY FOR BLOOD PRESSURE 90 tablet 0   ASPIRIN 81 PO Take 81 mg by mouth daily.     hydrochlorothiazide (HYDRODIURIL) 25 MG tablet TAKE 1 TABLET BY MOUTH EVERY DAY FOR BLOOD PRESSURE 90 tablet 3   losartan (COZAAR) 100 MG tablet Take 1 tablet (100 mg total) by mouth daily. For blood pressure. 90 tablet 3   metFORMIN (GLUCOPHAGE-XR) 500 MG 24 hr tablet Take 1 tablet (500 mg total) by mouth daily with breakfast. For diabetes. 90 tablet 3   PARoxetine (PAXIL) 20 MG tablet Take 1 tablet (20 mg total) by mouth daily. For anxiety. Office visit required for further refills. 90 tablet 0   rosuvastatin (CRESTOR) 10 MG tablet TAKE 1 TABLET BY MOUTH EVERY DAY IN THE EVENING FOR CHOLESTEROL 90 tablet 0   No facility-administered medications prior to visit.     Per HPI unless specifically indicated in ROS section below Review of Systems  Constitutional:  Positive for fatigue. Negative for chills and fever.  HENT:  Positive for congestion and rhinorrhea.   Respiratory:  Positive for cough. Negative for shortness of breath.   Gastrointestinal:  Negative for diarrhea.  Neurological:  Negative for dizziness and headaches.  Objective:  BP (!) 149/89    Pulse 72    Ht 6\' 1"  (1.854 m)    Wt 292 lb (132.5 kg)    BMI 38.52 kg/m   Wt Readings from Last 3 Encounters:  01/06/21 292 lb (132.5 kg)  12/09/20 (!) 310 lb (140.6 kg)  08/16/20 (!) 306 lb (138.8 kg)       Physical exam: General: Alert and oriented x 3, no distress, does not appear sickly  Pulmonary: Speaks in complete sentences  without increased work of breathing, dry cough noted during visit several times.  Psychiatric: Normal mood, thought content, and behavior.     Results for orders placed or performed in visit on 12/27/20  HM DIABETES EYE EXAM  Result Value Ref Range   HM Diabetic Eye Exam No Retinopathy No Retinopathy   Assessment & Plan:   Problem List Items Addressed This Visit       Respiratory   Viral URI with cough - Primary    Symptoms and presentation today represent viral etiology at this point.  Will treat conservatively.   Rx for Tessalon Perles 200 mg sent to pharmacy to take TID PRN.  He will update Monday next week, if no better then consider antibiotic treatment. Will provide work note. Discussed other OTC treatment.       Relevant Medications   benzonatate (TESSALON) 200 MG capsule     Endocrine   Type 2 diabetes mellitus with hyperglycemia (HCC)    Worse with increase in A1C to 8.8 from 6.9.  Commended him on his weight loss efforts, encouraged to continue!  Continue Metformin XR 500 mg daily for now. We will plan to meet back up in mid February 2023 for repeat A1C and follow up. He was advised to schedule a visit for then.        Meds ordered this encounter  Medications   benzonatate (TESSALON) 200 MG capsule    Sig: Take 1 capsule (200 mg total) by mouth 3 (three) times daily as needed for cough.    Dispense:  15 capsule    Refill:  0    Order Specific Question:   Supervising Provider    Answer:   BEDSOLE, AMY E [[id number]]   No orders of the defined types were placed in this encounter.   I discussed the assessment and treatment plan with the patient. The patient was provided an opportunity to ask questions and all were answered. The patient agreed with the plan and demonstrated an understanding of the instructions. The patient was advised to call back or seek an in-person evaluation if the symptoms worsen or if the condition fails to improve as anticipated.  Follow  up plan:  You may take Benzonatate capsules for cough. Take 1 capsule by mouth three times daily as needed for cough.  Please update me Monday next week.  Continue to work on Friday, start exercising! Keep up the great work with your weight loss!  It was a pleasure to see you today!   [employer], NP

## 2021-01-26 ENCOUNTER — Other Ambulatory Visit: Payer: Self-pay | Admitting: Primary Care

## 2021-01-26 DIAGNOSIS — F411 Generalized anxiety disorder: Secondary | ICD-10-CM

## 2021-01-26 DIAGNOSIS — E785 Hyperlipidemia, unspecified: Secondary | ICD-10-CM

## 2021-01-27 ENCOUNTER — Other Ambulatory Visit: Payer: Self-pay | Admitting: Primary Care

## 2021-01-27 DIAGNOSIS — I10 Essential (primary) hypertension: Secondary | ICD-10-CM

## 2021-02-22 ENCOUNTER — Other Ambulatory Visit: Payer: Self-pay | Admitting: Primary Care

## 2021-02-22 DIAGNOSIS — I10 Essential (primary) hypertension: Secondary | ICD-10-CM

## 2021-04-24 ENCOUNTER — Other Ambulatory Visit: Payer: Self-pay | Admitting: Primary Care

## 2021-04-24 DIAGNOSIS — E119 Type 2 diabetes mellitus without complications: Secondary | ICD-10-CM

## 2021-04-24 DIAGNOSIS — I10 Essential (primary) hypertension: Secondary | ICD-10-CM

## 2021-06-06 ENCOUNTER — Ambulatory Visit (INDEPENDENT_AMBULATORY_CARE_PROVIDER_SITE_OTHER): Payer: BC Managed Care – PPO | Admitting: Primary Care

## 2021-06-06 ENCOUNTER — Encounter: Payer: Self-pay | Admitting: Primary Care

## 2021-06-06 VITALS — BP 140/82 | HR 61 | Temp 98.6°F | Ht 73.0 in | Wt 287.0 lb

## 2021-06-06 DIAGNOSIS — E1165 Type 2 diabetes mellitus with hyperglycemia: Secondary | ICD-10-CM

## 2021-06-06 LAB — POCT GLYCOSYLATED HEMOGLOBIN (HGB A1C): Hemoglobin A1C: 6.4 % — AB (ref 4.0–5.6)

## 2021-06-06 NOTE — Assessment & Plan Note (Signed)
Improved with A1c of 6.4 today! ? ?Commended him on his dietary changes, encouraged to continue! ?Continue metformin XR 500 mg daily. ? ?Foot exam today. ?Eye exam due, he will schedule. ?Managed on statin and ARB. ?Pneumonia vaccine up-to-date. ?  ? ?Follow-up in 6 months ? ?

## 2021-06-06 NOTE — Patient Instructions (Signed)
Your A1c today is 6.4!!! ?Keep up the great work! ? ?Please schedule a physical to meet with me in 6 months.  ? ?It was a pleasure to see you today! ? ?

## 2021-06-06 NOTE — Progress Notes (Signed)
? ?Subjective:  ? ? Patient ID: Jared Bradley, male    DOB: 09-02-1960, 61 y.o.   MRN: [id number] ? ?HPI ? ?Jared Bradley is a very pleasant 61 y.o. male with a history of hypertension, type 2 diabetes, hyperlipidemia who presents today for follow-up of diabetes. ? ?Current medications include: Metformin XR 500 mg daily ? ?He is checking his blood glucose infrequently which is running low 200's.  ? ?Last A1C: 8.8 in November 2022, ?Last Eye Exam: Due soon ?Last Foot Exam: Due ?Pneumonia Vaccination: 2019 ?Urine Microalbumin: None.  Managed on ARB ?Statin: Rosuvastatin ? ?Dietary changes since last visit: Cutting back on bread, sweet drinks, pasta.  ? ? ?Exercise: No regular exercise ? ?Wt Readings from Last 3 Encounters:  ?06/06/21 287 lb (130.2 kg)  ?01/06/21 292 lb (132.5 kg)  ?12/09/20 (!) 310 lb (140.6 kg)  ? ? ? ? ? ?Review of Systems  ?Eyes:  Negative for visual disturbance.  ?Respiratory:  Negative for shortness of breath.   ?Cardiovascular:  Negative for chest pain.  ?Neurological:  Negative for dizziness and numbness.  ? ?   ? ? ?Past Medical History:  ?Diagnosis Date  ? Anxiety   ? Cellulitis, leg 05/13/2019  ? Hyperlipidemia   ? Hypertension   ? Poison ivy dermatitis 07/23/2019  ? ? ?Social History  ? ?Socioeconomic History  ? Marital status: Married  ?  Spouse name: Not on file  ? Number of children: Not on file  ? Years of education: Not on file  ? Highest education level: Not on file  ?Occupational History  ? Not on file  ?Tobacco Use  ? Smoking status: Former  ? Smokeless tobacco: Never  ?Substance and Sexual Activity  ? Alcohol use: No  ? Drug use: No  ? Sexual activity: Not on file  ?Other Topics Concern  ? Not on file  ?Social History Narrative  ? Married.  ? 3 children.  ? Works as a Naval architect  ? Enjoys hunting  ? ?Social Determinants of Health  ? ?Financial Resource Strain: Not on file  ?Food Insecurity: Not on file  ?Transportation Needs: Not on file  ?Physical Activity: Not on file   ?Stress: Not on file  ?Social Connections: Not on file  ?Intimate Partner Violence: Not on file  ? ? ?History reviewed. No pertinent surgical history. ? ?Family History  ?Problem Relation Age of Onset  ? Heart disease Father   ? Hypertension Father   ? Hyperlipidemia Father   ? Heart disease Sister   ? Diabetes Brother   ? Hypertension Maternal Grandmother   ? ? ?Allergies  ?Allergen Reactions  ? Hydrochlorothiazide W-Triamterene Nausea Only  ? Rosuvastatin Other (See Comments)  ?  Started after 5 years of treatment  ? Lipitor [Atorvastatin] Other (See Comments)  ?  Myalgias  ? ? ?Current Outpatient Medications on File Prior to Visit  ?Medication Sig Dispense Refill  ? amLODipine (NORVASC) 10 MG tablet TAKE 1 TABLET BY MOUTH EVERY DAY FOR BLOOD PRESSURE 90 tablet 2  ? ASPIRIN 81 PO Take 81 mg by mouth daily.    ? hydrochlorothiazide (HYDRODIURIL) 25 MG tablet TAKE 1 TABLET BY MOUTH EVERY DAY FOR BLOOD PRESSURE 90 tablet 2  ? losartan (COZAAR) 100 MG tablet TAKE 1 TABLET BY MOUTH EVERY DAY FOR BLOOD PRESSURE 90 tablet 1  ? metFORMIN (GLUCOPHAGE-XR) 500 MG 24 hr tablet TAKE 1 TABLET (500 MG TOTAL) BY MOUTH DAILY WITH BREAKFAST. FOR DIABETES. 90 tablet 1  ?  PARoxetine (PAXIL) 20 MG tablet Take 1 tablet (20 mg total) by mouth daily. For anxiety. 90 tablet 2  ? rosuvastatin (CRESTOR) 10 MG tablet TAKE 1 TABLET BY MOUTH EVERY DAY IN THE EVENING FOR CHOLESTEROL 90 tablet 2  ? ?No current facility-administered medications on file prior to visit.  ? ? ?BP 140/82   Pulse 61   Temp 98.6 ?F (37 ?C) (Oral)   Ht 6\' 1"  (1.854 m)   Wt 287 lb (130.2 kg)   SpO2 96%   BMI 37.87 kg/m?  ?Objective:  ? Physical Exam ?Cardiovascular:  ?   Rate and Rhythm: Normal rate and regular rhythm.  ?Pulmonary:  ?   Effort: Pulmonary effort is normal.  ?   Breath sounds: Normal breath sounds. No wheezing or rales.  ?Musculoskeletal:  ?   Cervical back: Neck supple.  ?Skin: ?   General: Skin is warm and dry.  ?Neurological:  ?   Mental Status:  He is alert and oriented to person, place, and time.  ? ? ? ? ? ?   ?Assessment & Plan:  ? ? ? ? ?This visit occurred during the SARS-CoV-2 public health emergency.  Safety protocols were in place, including screening questions prior to the visit, additional usage of staff PPE, and extensive cleaning of exam room while observing appropriate contact time as indicated for disinfecting solutions.  ?

## 2021-09-12 ENCOUNTER — Telehealth: Payer: Self-pay | Admitting: Primary Care

## 2021-09-12 NOTE — Telephone Encounter (Signed)
Letter printed, signed, placed in Joellen's inbox.  I included his most recent A1c result within the letter.

## 2021-09-12 NOTE — Telephone Encounter (Signed)
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 Call back [phone].

## 2021-09-13 NOTE — Telephone Encounter (Signed)
Patient notified by telephone that letter is up front ready to be picked up.

## 2021-09-18 LAB — HM DIABETES EYE EXAM

## 2021-10-31 ENCOUNTER — Other Ambulatory Visit: Payer: Self-pay | Admitting: Primary Care

## 2021-10-31 DIAGNOSIS — F411 Generalized anxiety disorder: Secondary | ICD-10-CM

## 2021-10-31 DIAGNOSIS — I10 Essential (primary) hypertension: Secondary | ICD-10-CM

## 2021-10-31 DIAGNOSIS — E119 Type 2 diabetes mellitus without complications: Secondary | ICD-10-CM

## 2021-12-12 ENCOUNTER — Encounter: Payer: BC Managed Care – PPO | Admitting: Primary Care

## 2021-12-22 ENCOUNTER — Encounter: Payer: Self-pay | Admitting: Primary Care

## 2021-12-22 ENCOUNTER — Ambulatory Visit (INDEPENDENT_AMBULATORY_CARE_PROVIDER_SITE_OTHER): Payer: BC Managed Care – PPO | Admitting: Primary Care

## 2021-12-22 VITALS — BP 124/84 | HR 80 | Temp 98.3°F | Ht 72.5 in | Wt 298.8 lb

## 2021-12-22 DIAGNOSIS — I10 Essential (primary) hypertension: Secondary | ICD-10-CM

## 2021-12-22 DIAGNOSIS — Z6839 Body mass index (BMI) 39.0-39.9, adult: Secondary | ICD-10-CM

## 2021-12-22 DIAGNOSIS — Z0000 Encounter for general adult medical examination without abnormal findings: Secondary | ICD-10-CM | POA: Diagnosis not present

## 2021-12-22 DIAGNOSIS — G4730 Sleep apnea, unspecified: Secondary | ICD-10-CM

## 2021-12-22 DIAGNOSIS — E1165 Type 2 diabetes mellitus with hyperglycemia: Secondary | ICD-10-CM

## 2021-12-22 DIAGNOSIS — Z125 Encounter for screening for malignant neoplasm of prostate: Secondary | ICD-10-CM

## 2021-12-22 DIAGNOSIS — E785 Hyperlipidemia, unspecified: Secondary | ICD-10-CM

## 2021-12-22 DIAGNOSIS — F411 Generalized anxiety disorder: Secondary | ICD-10-CM

## 2021-12-22 LAB — CBC
HCT: 41.6 % (ref 39.0–52.0)
Hemoglobin: 14.4 g/dL (ref 13.0–17.0)
MCHC: 34.6 g/dL (ref 30.0–36.0)
MCV: 92.8 fl (ref 78.0–100.0)
Platelets: 314 10*3/uL (ref 150.0–400.0)
RBC: 4.48 Mil/uL (ref 4.22–5.81)
RDW: 13.1 % (ref 11.5–15.5)
WBC: 4.9 10*3/uL (ref 4.0–10.5)

## 2021-12-22 LAB — MICROALBUMIN / CREATININE URINE RATIO
Creatinine,U: 239.5 mg/dL
Microalb Creat Ratio: 1.1 mg/g (ref 0.0–30.0)
Microalb, Ur: 2.6 mg/dL — ABNORMAL HIGH (ref 0.0–1.9)

## 2021-12-22 LAB — COMPREHENSIVE METABOLIC PANEL
ALT: 37 U/L (ref 0–53)
AST: 31 U/L (ref 0–37)
Albumin: 4.3 g/dL (ref 3.5–5.2)
Alkaline Phosphatase: 81 U/L (ref 39–117)
BUN: 18 mg/dL (ref 6–23)
CO2: 27 mEq/L (ref 19–32)
Calcium: 9.6 mg/dL (ref 8.4–10.5)
Chloride: 104 mEq/L (ref 96–112)
Creatinine, Ser: 0.79 mg/dL (ref 0.40–1.50)
GFR: 97.4 mL/min (ref >60.00)
Glucose, Bld: 143 mg/dL — ABNORMAL HIGH (ref 70–99)
Potassium: 4.2 mEq/L (ref 3.5–5.1)
Sodium: 139 mEq/L (ref 135–145)
Total Bilirubin: 0.5 mg/dL (ref 0.2–1.2)
Total Protein: 6.9 g/dL (ref 6.0–8.3)

## 2021-12-22 LAB — LIPID PANEL
Cholesterol: 211 mg/dL — ABNORMAL HIGH (ref 0–200)
HDL: 31.2 mg/dL — ABNORMAL LOW (ref >39.00)
LDL Cholesterol: 162 mg/dL — ABNORMAL HIGH (ref 0–99)
NonHDL: 180.19
Total CHOL/HDL Ratio: 7
Triglycerides: 90 mg/dL (ref 0.0–149.0)
VLDL: 18 mg/dL (ref 0.0–40.0)

## 2021-12-22 LAB — HEMOGLOBIN A1C: Hgb A1c MFr Bld: 7.7 % — ABNORMAL HIGH (ref 4.6–6.5)

## 2021-12-22 LAB — PSA: PSA: 1.28 ng/mL (ref 0.10–4.00)

## 2021-12-22 MED ORDER — OZEMPIC (0.25 OR 0.5 MG/DOSE) 2 MG/3ML ~~LOC~~ SOPN: PEN_INJECTOR | SUBCUTANEOUS | 0 refills | Status: DC

## 2021-12-22 NOTE — Assessment & Plan Note (Signed)
Repeat lipid panel pending. Continue rosuvastatin 10 mg daily. 

## 2021-12-22 NOTE — Assessment & Plan Note (Signed)
Agree to start Ozempic for weight loss and diabetes.  Repeat A1C pending today. Continue metformin XR 500 mg daily. Add Ozempic 0.25 mg weekly x 4 weeks, then increase to 0.5 mg weekly thereafter.   Urine microalbumin due and pending. Eye exam UTD, will get records.   Follow up in 3 months.

## 2021-12-22 NOTE — Assessment & Plan Note (Signed)
Immunizations UTD. Influenza vaccine provided today. PSA due and pending. Declines colonoscopy despite recommendations.   Discussed the importance of a healthy diet and regular exercise in order for weight loss, and to reduce the risk of further co-morbidity.  Exam stable. Labs pending.  Follow up in 1 year for repeat physical.

## 2021-12-22 NOTE — Assessment & Plan Note (Signed)
Controlled.  Continue losartan 100 mg daily, HCTZ 25 mg daily, and amlodipine 10 mg daily. CMP pending.

## 2021-12-22 NOTE — Assessment & Plan Note (Signed)
Discussed to get back on track with a better diet, start exercising.   Agree to start Ozempic for diabetes and weight loss.  Follow up in 3 months.

## 2021-12-22 NOTE — Assessment & Plan Note (Signed)
Controlled.  Continue paroxetine 20 mg daily.

## 2021-12-22 NOTE — Patient Instructions (Signed)
Start Ozempic once weekly for diabetes and weight loss. Start by injecting 0.25 mg into the skin once weekly for 4 weeks, then increase to 0.5 mg once weekly thereafter.  It is important that you improve your diet. Please limit carbohydrates in the form of white bread, rice, pasta, sweets, fast food, fried food, sugary drinks, etc. Increase your consumption of fresh fruits and vegetables, whole grains, lean protein.  Ensure you are consuming 64 ounces of water daily.  Please schedule a follow up visit for 3 months.  It was a pleasure to see you today!  Preventive Care 16-81 Years Old, Male Preventive care refers to lifestyle choices and visits with your health care provider that can promote health and wellness. Preventive care visits are also called wellness exams. What can I expect for my preventive care visit? Counseling During your preventive care visit, your health care provider may ask about your: Medical history, including: Past medical problems. Family medical history. Current health, including: Emotional well-being. Home life and relationship well-being. Sexual activity. Lifestyle, including: Alcohol, nicotine or tobacco, and drug use. Access to firearms. Diet, exercise, and sleep habits. Safety issues such as seatbelt and bike helmet use. Sunscreen use. Work and work Astronomer. Physical exam Your health care provider will check your: Height and weight. These may be used to calculate your BMI (body mass index). BMI is a measurement that tells if you are at a healthy weight. Waist circumference. This measures the distance around your waistline. This measurement also tells if you are at a healthy weight and may help predict your risk of certain diseases, such as type 2 diabetes and high blood pressure. Heart rate and blood pressure. Body temperature. Skin for abnormal spots. What immunizations do I need?  Vaccines are usually given at various ages, according to a  schedule. Your health care provider will recommend vaccines for you based on your age, medical history, and lifestyle or other factors, such as travel or where you work. What tests do I need? Screening Your health care provider may recommend screening tests for certain conditions. This may include: Lipid and cholesterol levels. Diabetes screening. This is done by checking your blood sugar (glucose) after you have not eaten for a while (fasting). Hepatitis B test. Hepatitis C test. HIV (human immunodeficiency virus) test. STI (sexually transmitted infection) testing, if you are at risk. Lung cancer screening. Prostate cancer screening. Colorectal cancer screening. Talk with your health care provider about your test results, treatment options, and if necessary, the need for more tests. Follow these instructions at home: Eating and drinking  Eat a diet that includes fresh fruits and vegetables, whole grains, lean protein, and low-fat dairy products. Take vitamin and mineral supplements as recommended by your health care provider. Do not drink alcohol if your health care provider tells you not to drink. If you drink alcohol: Limit how much you have to 0-2 drinks a day. Know how much alcohol is in your drink. In the [place] one drink equals one 12 oz bottle of beer (355 mL), one 5 oz glass of wine (148 mL), or one 1 oz glass of hard liquor (44 mL). Lifestyle Brush your teeth every morning and night with fluoride toothpaste. Floss one time each day. Exercise for at least 30 minutes 5 or more days each week. Do not use any products that contain nicotine or tobacco. These products include cigarettes, chewing tobacco, and vaping devices, such as e-cigarettes. If you need help quitting, ask your health care provider. Do  not use drugs. If you are sexually active, practice safe sex. Use a condom or other form of protection to prevent STIs. Take aspirin only as told by your health care provider. Make  sure that you understand how much to take and what form to take. Work with your health care provider to find out whether it is safe and beneficial for you to take aspirin daily. Find healthy ways to manage stress, such as: Meditation, yoga, or listening to music. Journaling. Talking to a trusted person. Spending time with friends and family. Minimize exposure to UV radiation to reduce your risk of skin cancer. Safety Always wear your seat belt while driving or riding in a vehicle. Do not drive: If you have been drinking alcohol. Do not ride with someone who has been drinking. When you are tired or distracted. While texting. If you have been using any mind-altering substances or drugs. Wear a helmet and other protective equipment during sports activities. If you have firearms in your house, make sure you follow all gun safety procedures. What's next? Go to your health care provider once a year for an annual wellness visit. Ask your health care provider how often you should have your eyes and teeth checked. Stay up to date on all vaccines. This information is not intended to replace advice given to you by your health care provider. Make sure you discuss any questions you have with your health care provider. Document Revised: 07/06/2020 Document Reviewed: 07/06/2020 Elsevier Patient Education  2023 [employer].

## 2021-12-22 NOTE — Progress Notes (Signed)
Subjective:    Patient ID: Jared Bradley, male    DOB: 1961-01-08, 61 y.o.   MRN: [id number]  HPI  Jared Bradley is a very pleasant 61 y.o. male who presents today for complete physical and follow up of chronic conditions.  He is interested in Ozempic for weight loss and diabetes treatment. He has struggled with obesity for years. His brother lost a lot of weight on Ozempic. He has not been eating a healthy diet, eating junk food and fast food. He is not exercising. He was working on weight loss but due to his busy schedule he's been unable to do so.   Immunizations: -Tetanus: 2015 -Influenza: Due today -Shingles: Completed course of Shingrix -Pneumonia: 2019  Diet: Fair diet.  Exercise: No regular exercise.  Eye exam: Completes annually  Dental exam: Completes semi-annually  Colonoscopy: Never completed, has declined and continues to decline.    PSA: Due  BP Readings from Last 3 Encounters:  12/22/21 124/84  06/06/21 140/82  01/06/21 (!) 149/89   Wt Readings from Last 3 Encounters:  12/22/21 298 lb 12.8 oz (135.5 kg)  06/06/21 287 lb (130.2 kg)  01/06/21 292 lb (132.5 kg)      Review of Systems  Constitutional:  Negative for unexpected weight change.  HENT:  Negative for rhinorrhea.   Respiratory:  Negative for cough and shortness of breath.   Cardiovascular:  Negative for chest pain.  Gastrointestinal:  Negative for constipation and diarrhea.  Genitourinary:  Negative for difficulty urinating.  Musculoskeletal:  Positive for arthralgias.  Skin:  Negative for rash.  Allergic/Immunologic: Negative for environmental allergies.  Neurological:  Negative for dizziness and headaches.  Psychiatric/Behavioral:  The patient is not nervous/anxious.          Past Medical History:  Diagnosis Date   Anxiety    Cellulitis, leg 05/13/2019   Cerumen impaction 08/16/2020   Hyperlipidemia    Hypertension    Poison ivy dermatitis 07/23/2019    Social  History   Socioeconomic History   Marital status: Married    Spouse name: Not on file   Number of children: Not on file   Years of education: Not on file   Highest education level: Not on file  Occupational History   Not on file  Tobacco Use   Smoking status: Former   Smokeless tobacco: Never  Substance and Sexual Activity   Alcohol use: No   Drug use: No   Sexual activity: Not on file  Other Topics Concern   Not on file  Social History Narrative   Married.   3 children.   Works as a Naval architectTruck Driver   Enjoys Contractorhunting   Social Determinants of Health   Financial Resource Strain: Not on [employer]file  Food Insecurity: Not on file  Transportation Needs: Not on file  Physical Activity: Not on file  Stress: Not on file  Social Connections: Not on file  Intimate Partner Violence: Not on file    No past surgical history on file.  Family History  Problem Relation Age of Onset   Heart disease Father    Hypertension Father    Hyperlipidemia Father    Heart disease Sister    Diabetes Brother    Hypertension Maternal Grandmother     Allergies  Allergen Reactions   Hydrochlorothiazide W-Triamterene Nausea Only   Rosuvastatin Other (See Comments)    Started after 5 years of treatment   Lipitor [Atorvastatin] Other (See Comments)    Myalgias  Current Outpatient Medications on File Prior to Visit  Medication Sig Dispense Refill   amLODipine (NORVASC) 10 MG tablet TAKE 1 TABLET BY MOUTH EVERY DAY FOR BLOOD PRESSURE 90 tablet 2   ASPIRIN 81 PO Take 81 mg by mouth daily.     hydrochlorothiazide (HYDRODIURIL) 25 MG tablet Take 1 tablet (25 mg total) by mouth daily. for blood pressure. Office visit required for further refills. 90 tablet 0   losartan (COZAAR) 100 MG tablet Take 1 tablet (100 mg total) by mouth daily. for blood pressure. Office visit required for further refills. 90 tablet 0   metFORMIN (GLUCOPHAGE-XR) 500 MG 24 hr tablet Take 1 tablet (500 mg total) by mouth daily with  breakfast. For diabetes. Office visit required for further refills. 90 tablet 0   PARoxetine (PAXIL) 20 MG tablet Take 1 tablet (20 mg total) by mouth daily. For anxiety. Office visit required for further refills. 90 tablet 0   rosuvastatin (CRESTOR) 10 MG tablet TAKE 1 TABLET BY MOUTH EVERY DAY IN THE EVENING FOR CHOLESTEROL 90 tablet 2   No current facility-administered medications on file prior to visit.    BP 124/84   Pulse 80   Temp 98.3 F (36.8 C) (Oral)   Ht 6' 0.5" (1.842 m)   Wt 298 lb 12.8 oz (135.5 kg)   SpO2 96%   BMI 39.97 kg/m  Objective:   Physical Exam HENT:     Right Ear: Tympanic membrane and ear canal normal.     Left Ear: Tympanic membrane and ear canal normal.     Nose: Nose normal.     Right Sinus: No maxillary sinus tenderness or frontal sinus tenderness.     Left Sinus: No maxillary sinus tenderness or frontal sinus tenderness.  Eyes:     Conjunctiva/sclera: Conjunctivae normal.  Neck:     Thyroid: No thyromegaly.     Vascular: No carotid bruit.  Cardiovascular:     Rate and Rhythm: Normal rate and regular rhythm.     Heart sounds: Normal heart sounds.  Pulmonary:     Effort: Pulmonary effort is normal.     Breath sounds: Normal breath sounds. No wheezing or rales.  Abdominal:     General: Bowel sounds are normal.     Palpations: Abdomen is soft.     Tenderness: There is no abdominal tenderness.  Musculoskeletal:        General: Normal range of motion.     Cervical back: Neck supple.  Skin:    General: Skin is warm and dry.  Neurological:     Mental Status: He is alert and oriented to person, place, and time.     Cranial Nerves: No cranial nerve deficit.     Deep Tendon Reflexes: Reflexes are normal and symmetric.  Psychiatric:        Mood and Affect: Mood normal.           Assessment & Plan:   Problem List Items Addressed This Visit       Cardiovascular and Mediastinum   Essential hypertension    Controlled.  Continue  losartan 100 mg daily, HCTZ 25 mg daily, and amlodipine 10 mg daily. CMP pending.       Relevant Orders   CBC     Respiratory   Sleep apnea    Compliant to CPAP machine nightly. Continue same.         Endocrine   Type 2 diabetes mellitus with hyperglycemia (HCC)    Agree to  start Ozempic for weight loss and diabetes.  Repeat A1C pending today. Continue metformin XR 500 mg daily. Add Ozempic 0.25 mg weekly x 4 weeks, then increase to 0.5 mg weekly thereafter.   Urine microalbumin due and pending. Eye exam UTD, will get records.   Follow up in 3 months.      Relevant Medications   Semaglutide,0.25 or 0.5MG /DOS, (OZEMPIC, 0.25 OR 0.5 MG/DOSE,) 2 MG/3ML SOPN   Other Relevant Orders   Microalbumin / creatinine urine ratio   Hemoglobin A1c     Other   GAD (generalized anxiety disorder)    Controlled.  Continue paroxetine 20 mg daily.      Preventative health care - Primary    Immunizations UTD. Influenza vaccine provided today. PSA due and pending. Declines colonoscopy despite recommendations.   Discussed the importance of a healthy diet and regular exercise in order for weight loss, and to reduce the risk of further co-morbidity.  Exam stable. Labs pending.  Follow up in 1 year for repeat physical.       Hyperlipidemia    Repeat lipid panel pending.  Continue rosuvastatin 10 mg daily.      Relevant Orders   Lipid panel   Comprehensive metabolic panel   Class 2 obesity due to excess calories with body mass index (BMI) of 39.0 to 39.9 in adult    Discussed to get back on track with a better diet, start exercising.   Agree to start Ozempic for diabetes and weight loss.  Follow up in 3 months.      Relevant Medications   Semaglutide,0.25 or 0.5MG /DOS, (OZEMPIC, 0.25 OR 0.5 MG/DOSE,) 2 MG/3ML SOPN   Other Visit Diagnoses     Screening for prostate cancer       Relevant Orders   PSA          Doreene Nest, NP

## 2021-12-22 NOTE — Assessment & Plan Note (Signed)
Compliant to CPAP machine nightly. Continue same. 

## 2021-12-27 ENCOUNTER — Encounter: Payer: Self-pay | Admitting: Primary Care

## 2022-01-28 ENCOUNTER — Other Ambulatory Visit: Payer: Self-pay | Admitting: Primary Care

## 2022-01-28 DIAGNOSIS — F411 Generalized anxiety disorder: Secondary | ICD-10-CM

## 2022-01-28 DIAGNOSIS — E119 Type 2 diabetes mellitus without complications: Secondary | ICD-10-CM

## 2022-01-28 DIAGNOSIS — I10 Essential (primary) hypertension: Secondary | ICD-10-CM

## 2022-01-29 ENCOUNTER — Other Ambulatory Visit: Payer: Self-pay | Admitting: Primary Care

## 2022-01-29 DIAGNOSIS — I10 Essential (primary) hypertension: Secondary | ICD-10-CM

## 2022-02-01 ENCOUNTER — Other Ambulatory Visit: Payer: Self-pay | Admitting: Primary Care

## 2022-02-01 DIAGNOSIS — I10 Essential (primary) hypertension: Secondary | ICD-10-CM

## 2022-03-23 ENCOUNTER — Ambulatory Visit: Payer: BC Managed Care – PPO | Admitting: Primary Care

## 2022-05-19 ENCOUNTER — Other Ambulatory Visit: Payer: Self-pay | Admitting: Primary Care

## 2022-05-19 DIAGNOSIS — E119 Type 2 diabetes mellitus without complications: Secondary | ICD-10-CM

## 2022-05-19 NOTE — Telephone Encounter (Signed)
Patient is due for diabetes follow up in early June, this will be required prior to any further refills.  Please schedule, thank you!

## 2022-05-21 NOTE — Telephone Encounter (Signed)
Patient has been scheduled

## 2022-05-31 ENCOUNTER — Ambulatory Visit: Payer: BC Managed Care – PPO | Admitting: Podiatry

## 2022-06-07 ENCOUNTER — Ambulatory Visit (INDEPENDENT_AMBULATORY_CARE_PROVIDER_SITE_OTHER): Payer: BC Managed Care – PPO

## 2022-06-07 ENCOUNTER — Ambulatory Visit (INDEPENDENT_AMBULATORY_CARE_PROVIDER_SITE_OTHER): Payer: BC Managed Care – PPO | Admitting: Podiatry

## 2022-06-07 DIAGNOSIS — M722 Plantar fascial fibromatosis: Secondary | ICD-10-CM

## 2022-06-07 MED ORDER — DICLOFENAC SODIUM 75 MG PO TBEC: 75.0000 mg | DELAYED_RELEASE_TABLET | Freq: Two times a day (BID) | ORAL | 2 refills | Status: DC

## 2022-06-07 MED ORDER — TRIAMCINOLONE ACETONIDE 10 MG/ML IJ SUSP
10.0000 mg | Freq: Once | INTRAMUSCULAR | Status: AC
  Administered 2022-06-07: 10 mg

## 2022-06-07 NOTE — Patient Instructions (Signed)

## 2022-06-07 NOTE — Progress Notes (Signed)
Subjective:   Patient ID: Jared Bradley, male   DOB: 62 y.o.   MRN: [id number]   HPI Patient presents with a lot of pain in the plantar heel left at the insertional point of the tendon into the calcaneus with inflammation fluid around the medial band.  Had previous history of this does have moderate obesity and diabetes under reasonably good control.  Patient does not smoke likes to be active   Review of Systems  All other systems reviewed and are negative.       Objective:  Physical Exam Vitals and nursing note reviewed.  Constitutional:      Appearance: He is well-developed.  Pulmonary:     Effort: Pulmonary effort is normal.  Musculoskeletal:        General: Normal range of motion.  Skin:    General: Skin is warm.  Neurological:     Mental Status: He is alert.     Neurovascular status intact muscle strength found to be adequate range of motion adequate exquisite discomfort noted medial fascial band left at the insertional point tendon calcaneus with moderate swelling noted.  Good digital perfusion well-oriented x 3     Assessment:  Acute Planter fasciitis left inflammation fluid in the medial band moderate depression of the arch     Plan:  H&P x-ray reviewed went ahead today educated him on Planter fasciitis sterile prep done injected the plantar fascia at insertion 3 mg Kenalog 5 mg Xylocaine and applied fascial brace to lift up the arch properly fitted into the arch and placed on oral diclofenac.  Instructed on anti-inflammatory usage shoe gear modification reappoint 2 weeks  X-rays indicate large spur formation no indication stress fracture arthritis moderate depression of the arch

## 2022-06-21 ENCOUNTER — Ambulatory Visit: Payer: BC Managed Care – PPO | Admitting: Podiatry

## 2022-06-28 ENCOUNTER — Encounter: Payer: Self-pay | Admitting: Primary Care

## 2022-06-28 ENCOUNTER — Ambulatory Visit (INDEPENDENT_AMBULATORY_CARE_PROVIDER_SITE_OTHER): Payer: BC Managed Care – PPO | Admitting: Primary Care

## 2022-06-28 VITALS — BP 132/78 | HR 80 | Temp 97.7°F | Ht 72.5 in | Wt 301.0 lb

## 2022-06-28 DIAGNOSIS — N529 Male erectile dysfunction, unspecified: Secondary | ICD-10-CM | POA: Diagnosis not present

## 2022-06-28 DIAGNOSIS — Z7985 Long-term (current) use of injectable non-insulin antidiabetic drugs: Secondary | ICD-10-CM

## 2022-06-28 DIAGNOSIS — E1165 Type 2 diabetes mellitus with hyperglycemia: Secondary | ICD-10-CM

## 2022-06-28 DIAGNOSIS — E785 Hyperlipidemia, unspecified: Secondary | ICD-10-CM

## 2022-06-28 LAB — POCT GLYCOSYLATED HEMOGLOBIN (HGB A1C): Hemoglobin A1C: 9 % — AB (ref 4.0–5.6)

## 2022-06-28 LAB — TESTOSTERONE: Testosterone: 335.61 ng/dL (ref 300.00–890.00)

## 2022-06-28 MED ORDER — TADALAFIL 10 MG PO TABS: ORAL_TABLET | ORAL | 0 refills | Status: AC

## 2022-06-28 NOTE — Progress Notes (Signed)
Subjective:    Patient ID: Jared Bradley, male    DOB: 08/20/1960, 62 y.o.   MRN: [id number]  HPI  Jared Bradley is a very pleasant 62 y.o. male with a history of hypertension, sleep apnea, type 2 diabetes, hyperlipidemia, obesity who presents today for follow-up of diabetes and erectile dysfunction .  1) Type 2 Diabetes: Current medications include: Metformin XR 500 mg daily, Ozempic 0.5 mg weekly.  He never tried Ozempic when it was prescribed in December 2023 as he was nervous about hearsay about side effects.   He is checking his blood glucose 0 times daily.  Last A1C: 7.7 in December 2023, 9.0 today Last Eye Exam: Up-to-date Last Foot Exam: Due Pneumonia Vaccination: 2019 Urine Microalbumin: Up-to-date Statin: Rosuvastatin, he stopped taking this >6 months ago.  Dietary changes since last visit: Poor diet overall, has gained 13 pounds since last year.   Exercise: Recently began walking.  Wt Readings from Last 3 Encounters:  06/28/22 (!) 301 lb (136.5 kg)  12/22/21 298 lb 12.8 oz (135.5 kg)  06/06/21 287 lb (130.2 kg)     BP Readings from Last 3 Encounters:  06/28/22 132/78  12/22/21 124/84  06/06/21 140/82   2) Erectile Dysfunction: Chronic. Mostly difficulty with maintaining an erection. He's tried sildenafil without improvement, even with the 100 mg dose.   He's never had his testosterone checked. He is currently taking a supplement OTC for low testosterone for the last 2 weeks.     Review of Systems  Eyes:  Negative for visual disturbance.  Gastrointestinal:  Positive for diarrhea. Negative for abdominal pain.  Neurological:  Negative for numbness.         Past Medical History:  Diagnosis Date   Anxiety    Cellulitis, leg 05/13/2019   Cerumen impaction 08/16/2020   Hyperlipidemia    Hypertension    Poison ivy dermatitis 07/23/2019    Social History   Socioeconomic History   Marital status: Married    Spouse name: Not on file    Number of children: Not on file   Years of education: Not on file   Highest education level: Not on file  Occupational History   Not on file  Tobacco Use   Smoking status: Former   Smokeless tobacco: Never  Substance and Sexual Activity   Alcohol use: No   Drug use: No   Sexual activity: Not on file  Other Topics Concern   Not on file  Social History Narrative   Married.   3 children.   Works as a Naval architect   Enjoys Contractor: Not on [employer] Insecurity: Not on file  Transportation Needs: Not on file  Physical Activity: Not on file  Stress: Not on file  Social Connections: Not on file  Intimate Partner Violence: Not on file    History reviewed. No pertinent surgical history.  Family History  Problem Relation Age of Onset   Heart disease Father    Hypertension Father    Hyperlipidemia Father    Heart disease Sister    Diabetes Brother    Hypertension Maternal Grandmother     Allergies  Allergen Reactions   Hydrochlorothiazide W-Triamterene Nausea Only   Rosuvastatin Other (See Comments)    Started after 5 years of treatment   Lipitor [Atorvastatin] Other (See Comments)    Myalgias    Current Outpatient Medications on File Prior to Visit  Medication  Sig Dispense Refill   amLODipine (NORVASC) 10 MG tablet TAKE 1 TABLET BY MOUTH EVERY DAY FOR BLOOD PRESSURE 90 tablet 2   ASPIRIN 81 PO Take 81 mg by mouth daily.     diclofenac (VOLTAREN) 75 MG EC tablet Take 1 tablet (75 mg total) by mouth 2 (two) times daily. 50 tablet 2   hydrochlorothiazide (HYDRODIURIL) 25 MG tablet Take 1 tablet (25 mg total) by mouth daily. for blood pressure. 90 tablet 3   losartan (COZAAR) 100 MG tablet Take 1 tablet (100 mg total) by mouth daily. for blood pressure. 90 tablet 2   metFORMIN (GLUCOPHAGE-XR) 500 MG 24 hr tablet Take 1 tablet (500 mg total) by mouth daily with breakfast. for diabetes. 90 tablet 1   PARoxetine  (PAXIL) 20 MG tablet Take 1 tablet (20 mg total) by mouth daily. For anxiety. 90 tablet 3   rosuvastatin (CRESTOR) 10 MG tablet TAKE 1 TABLET BY MOUTH EVERY DAY IN THE EVENING FOR CHOLESTEROL (Patient not taking: Reported on 06/28/2022) 90 tablet 2   Semaglutide,0.25 or 0.5MG /DOS, (OZEMPIC, 0.25 OR 0.5 MG/DOSE,) 2 MG/3ML SOPN Inject 0.25 mg into the skin once weekly x 4 weeks, then increase to 0.5 mg once weekly thereafter. (Patient not taking: Reported on 06/28/2022) 9 mL 0   No current facility-administered medications on file prior to visit.    BP 132/78   Pulse 80   Temp 97.7 F (36.5 C) (Temporal)   Ht 6' 0.5" (1.842 m)   Wt (!) 301 lb (136.5 kg)   SpO2 97%   BMI 40.26 kg/m  Objective:   Physical Exam Cardiovascular:     Rate and Rhythm: Normal rate and regular rhythm.  Pulmonary:     Effort: Pulmonary effort is normal.     Breath sounds: Normal breath sounds. No wheezing or rales.  Musculoskeletal:     Cervical back: Neck supple.  Skin:    General: Skin is warm and dry.  Neurological:     Mental Status: He is alert and oriented to person, place, and time.           Assessment & Plan:  Type 2 diabetes mellitus with hyperglycemia, without long-term current use of insulin (HCC) Assessment & Plan: Uncontrolled with A1C of 9.0, increased from 7.7 six months ago.  Discussed to start Ozempic as prescribed 6 months ago. He agrees and will start.  Start semaglutide (Ozempic) for diabetes/weight loss. Start by injecting 0.25 mg into the skin once weekly for 4 weeks, then increase to 0.5 mg once weekly thereafter. He will notify me once you've used your last 0.5 mg pen so that I can prescribe the next dose.   He will notify if Ozempic is cost prohibitive.   We also had a frank conversation to start working on weight loss with dietary changes.   Follow up in 3 months.   Orders: -     POCT glycosylated hemoglobin (Hb A1C)  Erectile dysfunction, unspecified erectile  dysfunction type Assessment & Plan: Chronic, failed sildenafil previously.   Checking testosterone levels today. Start tadalafil 10 mg as needed.  He will update.  Orders: -     Testosterone -     Tadalafil; Take 1 tablet by mouth 30 to 60 minutes prior to sexual activity as needed.  Dispense: 30 tablet; Refill: 0  Hyperlipidemia, unspecified hyperlipidemia type Assessment & Plan: Strongly advised that he resume rosuvastatin 10 mg as prescribed, especially given his prior LDL reading. He agrees to resume rosuvastatin 10  mg daily.  Recheck lipids next visit.         Doreene Nest, NP

## 2022-06-28 NOTE — Assessment & Plan Note (Addendum)
Uncontrolled with A1C of 9.0, increased from 7.7 six months ago.  Discussed to start Ozempic as prescribed 6 months ago. He agrees and will start.  Start semaglutide (Ozempic) for diabetes/weight loss. Start by injecting 0.25 mg into the skin once weekly for 4 weeks, then increase to 0.5 mg once weekly thereafter. He will notify me once you've used your last 0.5 mg pen so that I can prescribe the next dose.   He will notify if Ozempic is cost prohibitive.   We also had a frank conversation to start working on weight loss with dietary changes.   Follow up in 3 months.

## 2022-06-28 NOTE — Assessment & Plan Note (Signed)
Strongly advised that he resume rosuvastatin 10 mg as prescribed, especially given his prior LDL reading. He agrees to resume rosuvastatin 10 mg daily.  Recheck lipids next visit.

## 2022-06-28 NOTE — Patient Instructions (Addendum)
Start semaglutide (Ozempic) for diabetes/weight loss. Start by injecting 0.25 mg into the skin once weekly for 4 weeks, then increase to 0.5 mg once weekly thereafter. Please notify me once you've used your last 0.5 mg pen so that I can prescribe the next dose.   Stop by the lab prior to leaving today. I will notify you of your results once received.   Start tadalafil (Cialis) as needed for erectile dysfunction.  Take 1 tablet by mouth 30 to 60 minutes prior to sexual activity.  Please schedule a follow up visit for 3 months.  It was a pleasure to see you today!

## 2022-06-28 NOTE — Assessment & Plan Note (Signed)
Chronic, failed sildenafil previously.   Checking testosterone levels today. Start tadalafil 10 mg as needed.  He will update.

## 2022-07-04 ENCOUNTER — Other Ambulatory Visit: Payer: Self-pay | Admitting: Primary Care

## 2022-07-04 DIAGNOSIS — E785 Hyperlipidemia, unspecified: Secondary | ICD-10-CM

## 2022-07-21 ENCOUNTER — Other Ambulatory Visit: Payer: Self-pay | Admitting: Primary Care

## 2022-07-21 DIAGNOSIS — E119 Type 2 diabetes mellitus without complications: Secondary | ICD-10-CM

## 2022-08-23 ENCOUNTER — Ambulatory Visit
Admission: RE | Admit: 2022-08-23 | Discharge: 2022-08-23 | Disposition: A | Discharge status: Home / Self Care | Payer: Worker's Compensation | Source: Home / Self Care | Attending: Physician Assistant | Admitting: Physician Assistant

## 2022-08-23 ENCOUNTER — Ambulatory Visit
Admission: RE | Admit: 2022-08-23 | Discharge: 2022-08-23 | Disposition: A | Discharge status: Home / Self Care | Payer: Worker's Compensation | Source: Ambulatory Visit | Attending: Physician Assistant | Admitting: Physician Assistant

## 2022-08-23 ENCOUNTER — Other Ambulatory Visit: Payer: Self-pay | Admitting: Physician Assistant

## 2022-08-23 DIAGNOSIS — M25512 Pain in left shoulder: Secondary | ICD-10-CM | POA: Diagnosis present

## 2022-09-04 ENCOUNTER — Other Ambulatory Visit: Payer: Self-pay | Admitting: Podiatry

## 2022-09-06 ENCOUNTER — Encounter (INDEPENDENT_AMBULATORY_CARE_PROVIDER_SITE_OTHER): Payer: Self-pay

## 2022-09-07 ENCOUNTER — Other Ambulatory Visit: Payer: Self-pay | Admitting: Primary Care

## 2022-09-07 DIAGNOSIS — E1165 Type 2 diabetes mellitus with hyperglycemia: Secondary | ICD-10-CM

## 2022-09-28 ENCOUNTER — Ambulatory Visit (INDEPENDENT_AMBULATORY_CARE_PROVIDER_SITE_OTHER): Payer: BC Managed Care – PPO | Admitting: Primary Care

## 2022-09-28 ENCOUNTER — Encounter: Payer: Self-pay | Admitting: Primary Care

## 2022-09-28 VITALS — BP 126/64 | HR 78 | Temp 98.4°F | Ht 72.5 in | Wt 295.0 lb

## 2022-09-28 DIAGNOSIS — Z7985 Long-term (current) use of injectable non-insulin antidiabetic drugs: Secondary | ICD-10-CM

## 2022-09-28 DIAGNOSIS — T466X5A Adverse effect of antihyperlipidemic and antiarteriosclerotic drugs, initial encounter: Secondary | ICD-10-CM | POA: Insufficient documentation

## 2022-09-28 DIAGNOSIS — E1165 Type 2 diabetes mellitus with hyperglycemia: Secondary | ICD-10-CM

## 2022-09-28 DIAGNOSIS — M791 Myalgia, unspecified site: Secondary | ICD-10-CM | POA: Diagnosis not present

## 2022-09-28 DIAGNOSIS — E785 Hyperlipidemia, unspecified: Secondary | ICD-10-CM | POA: Diagnosis not present

## 2022-09-28 LAB — POCT GLYCOSYLATED HEMOGLOBIN (HGB A1C): Hemoglobin A1C: 7.7 % — AB (ref 4.0–5.6)

## 2022-09-28 MED ORDER — PRAVASTATIN SODIUM 40 MG PO TABS
40.0000 mg | ORAL_TABLET | Freq: Every day | ORAL | 0 refills | Status: DC
Start: 2022-09-28 — End: 2023-02-15

## 2022-09-28 MED ORDER — TIRZEPATIDE 2.5 MG/0.5ML ~~LOC~~ SOAJ
2.5000 mg | SUBCUTANEOUS | 0 refills | Status: DC
Start: 2022-09-28 — End: 2022-10-23

## 2022-09-28 NOTE — Assessment & Plan Note (Signed)
Intolerant to rosuvastatin and atorvastatin.  He is willing to try pravastatin. Start pravastatin 40 mg daily.  He will update if he experiences myalgias.

## 2022-09-28 NOTE — Assessment & Plan Note (Signed)
Failed atorvastatin and rosuvastatin.  Start pravastatin 40 mg daily.  He will update if he experiences myalgias.

## 2022-09-28 NOTE — Patient Instructions (Signed)
We increased your Ozempic to 1 mg weekly.  You may double up on your 0.5 mg dose.  We will try Mounjaro to see if this is more cost friendly.  Please notify me if this is not the case.  Start pravastatin 40 mg once daily for cholesterol.  Please schedule a physical to meet with me in 3 months.   It was a pleasure to see you today!

## 2022-09-28 NOTE — Progress Notes (Signed)
Subjective:    Patient ID: Jared Bradley, male    DOB: May 19, 1960, 62 y.o.   MRN: [id number]  HPI  Jared Bradley is a very pleasant 62 y.o. male with a history of type 2 diabetes, hypertension, morbid obesity, sleep apnea, hyperlipidemia who presents today for follow-up of diabetes and hyperlipidemia.  Current medications include: Metformin XR 500 mg daily, Ozempic 0.5 mg weekly.  Ozempic is costing about $550 every 3 months. He denies GERD, constipation, nausea.   He is checking his blood glucose 0 times daily.  Last A1C: 9.0 in June 2024, 7.7 today Last Eye Exam: Due Last Foot Exam: Due Pneumonia Vaccination: 2019 Urine Microalbumin: Up-to-date Statin: Rosuvastatin resumed 3 months ago, but could not tolerate due to pain.   Dietary changes since last visit: Decreased appetite. Not watching diet.   Exercise: None.   BP Readings from Last 3 Encounters:  09/28/22 126/64  06/28/22 132/78  12/22/21 124/84   Wt Readings from Last 3 Encounters:  09/28/22 295 lb (133.8 kg)  06/28/22 (!) 301 lb (136.5 kg)  12/22/21 298 lb 12.8 oz (135.5 kg)       Review of Systems  Eyes:  Negative for visual disturbance.  Respiratory:  Negative for shortness of breath.   Cardiovascular:  Negative for chest pain.  Gastrointestinal:  Negative for abdominal pain and constipation.  Neurological:  Negative for dizziness and numbness.         Past Medical History:  Diagnosis Date   Anxiety    Cellulitis, leg 05/13/2019   Cerumen impaction 08/16/2020   Hyperlipidemia    Hypertension    Poison ivy dermatitis 07/23/2019    Social History   Socioeconomic History   Marital status: Married    Spouse name: Not on file   Number of children: Not on file   Years of education: Not on file   Highest education level: Not on file  Occupational History   Not on file  Tobacco Use   Smoking status: Former   Smokeless tobacco: Never  Substance and Sexual Activity   Alcohol  use: No   Drug use: No   Sexual activity: Not on file  Other Topics Concern   Not on file  Social History Narrative   Married.   3 children.   Works as a Naval architect   Enjoys Chartered certified accountant Strain: Not on [employer] Insecurity: Not on file  Transportation Needs: Not on file  Physical Activity: Not on file  Stress: Not on file (03/03/2019)  Social Connections: Not on file  Intimate Partner Violence: Low Risk  (05/08/2019)   Received from [facility]   Intimate Partner Violence    Insults You: Not on file    Threatens You: Not on file    Screams at You: Not on file    Physically Hurt: Not on file    Intimate Partner Violence Score: Not on file    History reviewed. No pertinent surgical history.  Family History  Problem Relation Age of Onset   Heart disease Father    Hypertension Father    Hyperlipidemia Father    Heart disease Sister    Diabetes Brother    Hypertension Maternal Grandmother     Allergies  Allergen Reactions   Hydrochlorothiazide W-Triamterene Nausea Only   Rosuvastatin Other (See Comments)    Started after 5 years of treatment   Lipitor [Atorvastatin] Other (See Comments)  Myalgias    Current Outpatient Medications on File Prior to Visit  Medication Sig Dispense Refill   amLODipine (NORVASC) 10 MG tablet TAKE 1 TABLET BY MOUTH EVERY DAY FOR BLOOD PRESSURE 90 tablet 2   ASPIRIN 81 PO Take 81 mg by mouth daily.     diclofenac (VOLTAREN) 75 MG EC tablet TAKE 1 TABLET BY MOUTH TWICE A DAY 50 tablet 2   hydrochlorothiazide (HYDRODIURIL) 25 MG tablet Take 1 tablet (25 mg total) by mouth daily. for blood pressure. 90 tablet 3   losartan (COZAAR) 100 MG tablet Take 1 tablet (100 mg total) by mouth daily. for blood pressure. 90 tablet 2   metFORMIN (GLUCOPHAGE-XR) 500 MG 24 hr tablet TAKE 1 TABLET (500 MG TOTAL) BY MOUTH DAILY WITH BREAKFAST. FOR DIABETES. 90 tablet 0   PARoxetine (PAXIL) 20 MG tablet Take  1 tablet (20 mg total) by mouth daily. For anxiety. 90 tablet 3   tadalafil (CIALIS) 10 MG tablet Take 1 tablet by mouth 30 to 60 minutes prior to sexual activity as needed. 30 tablet 0   No current facility-administered medications on file prior to visit.    BP 126/64   Pulse 78   Temp 98.4 F (36.9 C) (Temporal)   Ht 6' 0.5" (1.842 m)   Wt 295 lb (133.8 kg)   SpO2 99%   BMI 39.46 kg/m  Objective:   Physical Exam Cardiovascular:     Rate and Rhythm: Normal rate and regular rhythm.  Pulmonary:     Effort: Pulmonary effort is normal.     Breath sounds: Normal breath sounds. No wheezing or rales.  Musculoskeletal:     Cervical back: Neck supple.  Skin:    General: Skin is warm and dry.  Neurological:     Mental Status: He is alert and oriented to person, place, and time.           Assessment & Plan:  Type 2 diabetes mellitus with hyperglycemia, without long-term current use of insulin (HCC) Assessment & Plan: Improved with A1c of 7.7 today! Would like to see A1c less than 7.0, discussed this today.  He agrees.  Unfortunately, Ozempic has become cost prohibitive.  He does have a 13-month supply at home, increase Ozempic to 1 mg weekly, he will double up on a 0.5 mg dose.  Will try Mounjaro.  Start with 2.5 mg weekly x 4 weeks, then increase upward thereafter.  He will update if Greggory Keen is cost prohibitive.  Foot exam today. Follow-up in 3 months.  Orders: -     POCT glycosylated hemoglobin (Hb A1C) -     Tirzepatide; Inject 2.5 mg into the skin once a week. for diabetes.  Dispense: 2 mL; Refill: 0  Hyperlipidemia, unspecified hyperlipidemia type Assessment & Plan: Intolerant to rosuvastatin and atorvastatin.  He is willing to try pravastatin. Start pravastatin 40 mg daily.  He will update if he experiences myalgias.  Orders: -     Pravastatin Sodium; Take 1 tablet (40 mg total) by mouth daily. for cholesterol.  Dispense: 90 tablet; Refill: 0  Myalgia due to  statin Assessment & Plan: Failed atorvastatin and rosuvastatin.  Start pravastatin 40 mg daily.  He will update if he experiences myalgias.         Doreene Nest, NP

## 2022-09-28 NOTE — Assessment & Plan Note (Signed)
Improved with A1c of 7.7 today! Would like to see A1c less than 7.0, discussed this today.  He agrees.  Unfortunately, Ozempic has become cost prohibitive.  He does have a 20-month supply at home, increase Ozempic to 1 mg weekly, he will double up on a 0.5 mg dose.  Will try Mounjaro.  Start with 2.5 mg weekly x 4 weeks, then increase upward thereafter.  He will update if Greggory Keen is cost prohibitive.  Foot exam today. Follow-up in 3 months.

## 2022-10-22 DIAGNOSIS — E1165 Type 2 diabetes mellitus with hyperglycemia: Secondary | ICD-10-CM

## 2022-10-23 MED ORDER — SEMAGLUTIDE (1 MG/DOSE) 4 MG/3ML ~~LOC~~ SOPN
1.0000 mg | PEN_INJECTOR | SUBCUTANEOUS | 0 refills | Status: DC
Start: 2022-10-23 — End: 2023-03-12

## 2022-11-02 ENCOUNTER — Telehealth: Payer: Self-pay | Admitting: Primary Care

## 2022-11-02 NOTE — Telephone Encounter (Signed)
If he never filled the 1 mg dose, and he's been using the 0.5 mg dose, then he should be able to get the 1 mg dose now. When you called his pharmacy, did you ask about the 1 mg dose? Why can't they fill it until 11/09?

## 2022-11-02 NOTE — Telephone Encounter (Signed)
See MyChart message

## 2022-11-02 NOTE — Telephone Encounter (Signed)
Non urgent .Marland Kitchen OK for PCP to handle as she knows more about what is going on.

## 2022-11-02 NOTE — Telephone Encounter (Signed)
Patient called regarding mediation  Semaglutide, 1 MG/DOSE, 4 MG/3ML SOPN , says pharmacy is unable to refill this med until 11/9. Patient says that he has been doing the 0.5 mg dose and doubling it when Jae Dire raised the dose to 1mg . Patient wants to know if this is able to e filled with approval before 11/9? Says he is out of the 0.5 mg.

## 2022-11-02 NOTE — Telephone Encounter (Signed)
Yes I did ask about 1mg  dose, she said insurance will not cover it for 14 more days (she even checked to see if he could just get one box and was not able to).  She mentioned it looked like he had picked up at a different pharmacy not too long ago, I advised he was doubling up on the 0.5mg  dose that is why he has run out quicker. She said even with provider approval it cannot be filled early without having to pay out of pocket.

## 2022-11-02 NOTE — Telephone Encounter (Signed)
Called and spoke with pharmacy,  They stated insurance will not pay for it for another 14 days.   Called and advised patient, he would like to know if it is a concern for his health to wait 2 weeks and then begin again at the 1mg  dose. He is considering paying out of pocket it if it will affect him. Please advise. PCP out of office, sending to provider in office.

## 2022-11-08 ENCOUNTER — Other Ambulatory Visit: Payer: Self-pay | Admitting: Primary Care

## 2022-11-08 DIAGNOSIS — E119 Type 2 diabetes mellitus without complications: Secondary | ICD-10-CM

## 2022-11-08 DIAGNOSIS — I10 Essential (primary) hypertension: Secondary | ICD-10-CM

## 2022-11-25 ENCOUNTER — Other Ambulatory Visit: Payer: Self-pay | Admitting: Podiatry

## 2022-12-28 ENCOUNTER — Ambulatory Visit (INDEPENDENT_AMBULATORY_CARE_PROVIDER_SITE_OTHER): Payer: BC Managed Care – PPO | Admitting: Primary Care

## 2022-12-28 ENCOUNTER — Encounter: Payer: Self-pay | Admitting: Primary Care

## 2022-12-28 VITALS — BP 114/78 | HR 84 | Temp 97.7°F | Ht 72.5 in | Wt 293.0 lb

## 2022-12-28 DIAGNOSIS — N529 Male erectile dysfunction, unspecified: Secondary | ICD-10-CM

## 2022-12-28 DIAGNOSIS — Z125 Encounter for screening for malignant neoplasm of prostate: Secondary | ICD-10-CM | POA: Diagnosis not present

## 2022-12-28 DIAGNOSIS — F411 Generalized anxiety disorder: Secondary | ICD-10-CM

## 2022-12-28 DIAGNOSIS — Z0000 Encounter for general adult medical examination without abnormal findings: Secondary | ICD-10-CM | POA: Diagnosis not present

## 2022-12-28 DIAGNOSIS — Z6839 Body mass index (BMI) 39.0-39.9, adult: Secondary | ICD-10-CM

## 2022-12-28 DIAGNOSIS — E1165 Type 2 diabetes mellitus with hyperglycemia: Secondary | ICD-10-CM | POA: Diagnosis not present

## 2022-12-28 DIAGNOSIS — G4730 Sleep apnea, unspecified: Secondary | ICD-10-CM

## 2022-12-28 DIAGNOSIS — I10 Essential (primary) hypertension: Secondary | ICD-10-CM | POA: Diagnosis not present

## 2022-12-28 DIAGNOSIS — Z23 Encounter for immunization: Secondary | ICD-10-CM | POA: Diagnosis not present

## 2022-12-28 DIAGNOSIS — T466X5A Adverse effect of antihyperlipidemic and antiarteriosclerotic drugs, initial encounter: Secondary | ICD-10-CM

## 2022-12-28 DIAGNOSIS — M791 Myalgia, unspecified site: Secondary | ICD-10-CM

## 2022-12-28 DIAGNOSIS — E66812 Obesity, class 2: Secondary | ICD-10-CM | POA: Diagnosis not present

## 2022-12-28 DIAGNOSIS — E785 Hyperlipidemia, unspecified: Secondary | ICD-10-CM

## 2022-12-28 LAB — COMPREHENSIVE METABOLIC PANEL
ALT: 34 U/L (ref 0–53)
AST: 32 U/L (ref 0–37)
Albumin: 4.2 g/dL (ref 3.5–5.2)
Alkaline Phosphatase: 75 U/L (ref 39–117)
BUN: 16 mg/dL (ref 6–23)
CO2: 32 meq/L (ref 19–32)
Calcium: 10 mg/dL (ref 8.4–10.5)
Chloride: 102 meq/L (ref 96–112)
Creatinine, Ser: 0.94 mg/dL (ref 0.40–1.50)
GFR: 88.24 mL/min (ref >60.00)
Glucose, Bld: 153 mg/dL — ABNORMAL HIGH (ref 70–99)
Potassium: 4.4 meq/L (ref 3.5–5.1)
Sodium: 138 meq/L (ref 135–145)
Total Bilirubin: 0.6 mg/dL (ref 0.2–1.2)
Total Protein: 6.9 g/dL (ref 6.0–8.3)

## 2022-12-28 LAB — LIPID PANEL
Cholesterol: 172 mg/dL (ref 0–200)
HDL: 31.4 mg/dL — ABNORMAL LOW (ref >39.00)
LDL Cholesterol: 111 mg/dL — ABNORMAL HIGH (ref 0–99)
NonHDL: 140.69
Total CHOL/HDL Ratio: 5
Triglycerides: 148 mg/dL (ref 0.0–149.0)
VLDL: 29.6 mg/dL (ref 0.0–40.0)

## 2022-12-28 LAB — MICROALBUMIN / CREATININE URINE RATIO
Creatinine,U: 155.9 mg/dL
Microalb Creat Ratio: 0.5 mg/g (ref 0.0–30.0)
Microalb, Ur: 0.8 mg/dL (ref 0.0–1.9)

## 2022-12-28 LAB — HEMOGLOBIN A1C: Hgb A1c MFr Bld: 7 % — ABNORMAL HIGH (ref 4.6–6.5)

## 2022-12-28 LAB — PSA: PSA: 0.66 ng/mL (ref 0.10–4.00)

## 2022-12-28 NOTE — Patient Instructions (Signed)
Stop by the lab prior to leaving today. I will notify you of your results once received.   Please schedule a follow up visit for 6 months for a diabetes check.  It was a pleasure to see you today!   

## 2022-12-28 NOTE — Assessment & Plan Note (Signed)
Repeat lipid panel pending. Continue pravastatin 40 mg daily. 

## 2022-12-28 NOTE — Progress Notes (Signed)
Subjective:    Patient ID: Jared Bradley, male    DOB: 05-02-1960, 62 y.o.   MRN: [id number]  HPI  Jared Bradley is a very pleasant 62 y.o. male who presents today for complete physical and follow up of chronic conditions.  Immunizations: -Tetanus: Completed in 2015 -Influenza: Influenza vaccine provided today.  -Shingles: Completed Shingrix series -Pneumonia: Completed in 2019  Diet: Fair diet.  Exercise: No regular exercise.  Eye exam: Completed years ago  Dental exam: Completes semi-annually    Colonoscopy: Never completed, declined last year, declines this year.   PSA: Due   BP Readings from Last 3 Encounters:  12/28/22 114/78  09/28/22 126/64  06/28/22 132/78    Wt Readings from Last 3 Encounters:  12/28/22 293 lb (132.9 kg)  09/28/22 295 lb (133.8 kg)  06/28/22 (!) 301 lb (136.5 kg)       Review of Systems  Constitutional:  Negative for unexpected weight change.  HENT:  Negative for rhinorrhea.   Respiratory:  Negative for cough and shortness of breath.   Cardiovascular:  Negative for chest pain.  Gastrointestinal:  Negative for constipation and diarrhea.  Genitourinary:  Negative for difficulty urinating.  Musculoskeletal:  Negative for arthralgias and myalgias.  Skin:  Negative for rash.  Allergic/Immunologic: Negative for environmental allergies.  Neurological:  Negative for dizziness, numbness and headaches.  Psychiatric/Behavioral:  The patient is not nervous/anxious.          Past Medical History:  Diagnosis Date   Anxiety    Cellulitis, leg 05/13/2019   Cerumen impaction 08/16/2020   Hyperlipidemia    Hypertension    Poison ivy dermatitis 07/23/2019    Social History   Socioeconomic History   Marital status: Married    Spouse name: Not on file   Number of children: Not on file   Years of education: Not on file   Highest education level: GED or equivalent  Occupational History   Not on file  Tobacco Use    Smoking status: Former   Smokeless tobacco: Never  Substance and Sexual Activity   Alcohol use: No   Drug use: No   Sexual activity: Not on file  Other Topics Concern   Not on file  Social History Narrative   Married.   3 children.   Works as a Naval architect   Enjoys Chartered certified accountant Strain: Low Risk  (12/24/2022)   Overall Financial Resource Strain (CARDIA)    Difficulty of Paying Living Expenses: Not hard at all  Food Insecurity: No Food Insecurity (12/24/2022)   Hunger Vital Sign    Worried About Running Out of Food in the Last Year: Never true    Ran Out of Food in the Last Year: Never true  Transportation Needs: No Transportation Needs (12/24/2022)   PRAPARE - Administrator, Civil Service (Medical): No    Lack of Transportation (Non-Medical): No  Physical Activity: Insufficiently Active (12/24/2022)   Exercise Vital Sign    Days of Exercise per Week: 1 day    Minutes of Exercise per Session: 10 min  Stress: No Stress Concern Present (12/24/2022)   [employer] of Occupational Health - Occupational Stress Questionnaire    Feeling of Stress : Only a little  Social Connections: Moderately Integrated (12/24/2022)   Social Connection and Isolation Panel [NHANES]    Frequency of Communication with Friends and Family: More than three times a week  Frequency of Social Gatherings with Friends and Family: Once a week    Attends Religious Services: Never    Database administrator or Organizations: Yes    Attends Banker Meetings: 1 to 4 times per year    Marital Status: Married  Catering manager Violence: Low Risk  (05/08/2019)   Received from [facility]   Intimate Partner Violence    Insults You: Not on file    Threatens You: Not on file    Screams at You: Not on file    Physically Hurt: Not on file    Intimate Partner Violence Score: Not on file    History reviewed. No pertinent surgical  history.  Family History  Problem Relation Age of Onset   Heart disease Father    Hypertension Father    Hyperlipidemia Father    Heart disease Sister    Diabetes Brother    Hypertension Maternal Grandmother     Allergies  Allergen Reactions   Hydrochlorothiazide W-Triamterene Nausea Only   Rosuvastatin Other (See Comments)    Started after 5 years of treatment   Lipitor [Atorvastatin] Other (See Comments)    Myalgias    Current Outpatient Medications on File Prior to Visit  Medication Sig Dispense Refill   amLODipine (NORVASC) 10 MG tablet TAKE 1 TABLET BY MOUTH EVERY DAY FOR BLOOD PRESSURE 90 tablet 0   ASPIRIN 81 PO Take 81 mg by mouth daily.     diclofenac (VOLTAREN) 75 MG EC tablet TAKE 1 TABLET BY MOUTH TWICE A DAY 50 tablet 2   hydrochlorothiazide (HYDRODIURIL) 25 MG tablet Take 1 tablet (25 mg total) by mouth daily. for blood pressure. 90 tablet 3   losartan (COZAAR) 100 MG tablet TAKE 1 TABLET BY MOUTH EVERY DAY FOR BLOOD PRESSURE 90 tablet 0   metFORMIN (GLUCOPHAGE-XR) 500 MG 24 hr tablet TAKE 1 TABLET (500 MG TOTAL) BY MOUTH DAILY WITH BREAKFAST. FOR DIABETES. 90 tablet 0   PARoxetine (PAXIL) 20 MG tablet Take 1 tablet (20 mg total) by mouth daily. For anxiety. 90 tablet 3   pravastatin (PRAVACHOL) 40 MG tablet Take 1 tablet (40 mg total) by mouth daily. for cholesterol. 90 tablet 0   Semaglutide, 1 MG/DOSE, 4 MG/3ML SOPN Inject 1 mg as directed once a week. for diabetes. 9 mL 0   tadalafil (CIALIS) 10 MG tablet Take 1 tablet by mouth 30 to 60 minutes prior to sexual activity as needed. 30 tablet 0   No current facility-administered medications on file prior to visit.    BP 114/78   Pulse 84   Temp 97.7 F (36.5 C) (Temporal)   Ht 6' 0.5" (1.842 m)   Wt 293 lb (132.9 kg)   SpO2 97%   BMI 39.19 kg/m  Objective:   Physical Exam HENT:     Right Ear: Tympanic membrane and ear canal normal.     Left Ear: Tympanic membrane and ear canal normal.  Eyes:      Pupils: Pupils are equal, round, and reactive to light.  Cardiovascular:     Rate and Rhythm: Normal rate and regular rhythm.  Pulmonary:     Effort: Pulmonary effort is normal.     Breath sounds: Normal breath sounds.  Abdominal:     General: Bowel sounds are normal.     Palpations: Abdomen is soft.     Tenderness: There is no abdominal tenderness.  Musculoskeletal:        General: Normal range of motion.  Cervical back: Neck supple.  Skin:    General: Skin is warm and dry.  Neurological:     Mental Status: He is alert and oriented to person, place, and time.     Cranial Nerves: No cranial nerve deficit.     Deep Tendon Reflexes:     Reflex Scores:      Patellar reflexes are 2+ on the right side and 2+ on the left side. Psychiatric:        Mood and Affect: Mood normal.           Assessment & Plan:  Preventative health care Assessment & Plan: Immunizations UTD. Influenza vaccine provided today.  Colonoscopy overdue, he continues to decline.  Offered Cologuard, he declines. PSA due and pending.  Discussed the importance of a healthy diet and regular exercise in order for weight loss, and to reduce the risk of further co-morbidity.  Exam stable. Labs pending.  Follow up in 1 year for repeat physical.    Essential hypertension Assessment & Plan: Controlled!  Continue hydrochlorothiazide 25 mg daily, amlodipine 10 mg daily,  and losartan 100 mg daily. CMP pending.  Orders: -     Comprehensive metabolic panel  Sleep apnea, unspecified type Assessment & Plan: Continue CPAP nightly.   Type 2 diabetes mellitus with hyperglycemia, without long-term current use of insulin (HCC) Assessment & Plan: Repeat A1C pending.  Continue Ozempic 1 mg weekly, metformin ER 500 mg daily. Urine microalbumin due and pending.  Follow-up in 3 to 6 months based on A1c result.  Orders: -     Hemoglobin A1c -     Microalbumin / creatinine urine ratio  Class 2 severe  obesity due to excess calories with serious comorbidity and body mass index (BMI) of 39.0 to 39.9 in adult [facility]) Assessment & Plan: Commended him on weight loss expiration point  Continue Ozempic 1 mg weekly.   Erectile dysfunction, unspecified erectile dysfunction type Assessment & Plan: No concerns today.  Continue tadalafil 10 mg as needed.   GAD (generalized anxiety disorder) Assessment & Plan: Controlled.  Continue paroxetine 20 mg daily.   Hyperlipidemia, unspecified hyperlipidemia type Assessment & Plan: Repeat lipid panel pending.  Continue pravastatin 40 mg daily.  Orders: -     Lipid panel  Myalgia due to statin Assessment & Plan: Tolerating pravastatin. Continue pravastatin 40 mg daily.   Screening for prostate cancer -     PSA  Encounter for immunization -     Flu vaccine trivalent PF, 6mos and older(Flulaval,Afluria,Fluarix,Fluzone)        Doreene Nest, NP

## 2022-12-28 NOTE — Assessment & Plan Note (Signed)
No concerns today.  Continue tadalafil 10 mg as needed.

## 2022-12-28 NOTE — Assessment & Plan Note (Signed)
Immunizations UTD. Influenza vaccine provided today.  Colonoscopy overdue, he continues to decline.  Offered Cologuard, he declines. PSA due and pending.  Discussed the importance of a healthy diet and regular exercise in order for weight loss, and to reduce the risk of further co-morbidity.  Exam stable. Labs pending.  Follow up in 1 year for repeat physical.

## 2022-12-28 NOTE — Assessment & Plan Note (Signed)
Tolerating pravastatin. Continue pravastatin 40 mg daily.

## 2022-12-28 NOTE — Assessment & Plan Note (Signed)
Repeat A1C pending.  Continue Ozempic 1 mg weekly, metformin ER 500 mg daily. Urine microalbumin due and pending.  Follow-up in 3 to 6 months based on A1c result.

## 2022-12-28 NOTE — Assessment & Plan Note (Signed)
Continue CPAP nightly. °

## 2022-12-28 NOTE — Assessment & Plan Note (Signed)
Controlled.  Continue paroxetine 20 mg daily.

## 2022-12-28 NOTE — Assessment & Plan Note (Signed)
Controlled!  Continue hydrochlorothiazide 25 mg daily, amlodipine 10 mg daily,  and losartan 100 mg daily. CMP pending.

## 2022-12-28 NOTE — Assessment & Plan Note (Signed)
Commended him on weight loss expiration point  Continue Ozempic 1 mg weekly.

## 2023-02-05 ENCOUNTER — Encounter: Payer: Self-pay | Admitting: Family Medicine

## 2023-02-05 ENCOUNTER — Telehealth: Payer: Self-pay

## 2023-02-05 ENCOUNTER — Ambulatory Visit (INDEPENDENT_AMBULATORY_CARE_PROVIDER_SITE_OTHER): Payer: BC Managed Care – PPO | Admitting: Family Medicine

## 2023-02-05 VITALS — BP 118/76 | HR 77 | Temp 97.5°F | Ht 72.5 in | Wt 291.2 lb

## 2023-02-05 DIAGNOSIS — Z8614 Personal history of Methicillin resistant Staphylococcus aureus infection: Secondary | ICD-10-CM | POA: Insufficient documentation

## 2023-02-05 DIAGNOSIS — L03116 Cellulitis of left lower limb: Secondary | ICD-10-CM | POA: Insufficient documentation

## 2023-02-05 HISTORY — DX: Cellulitis of left lower limb: L03.116

## 2023-02-05 MED ORDER — MUPIROCIN 2 % EX OINT: 1.0000 | TOPICAL_OINTMENT | Freq: Two times a day (BID) | CUTANEOUS | 0 refills | Status: DC

## 2023-02-05 MED ORDER — DOXYCYCLINE HYCLATE 100 MG PO TABS: 100.0000 mg | ORAL_TABLET | Freq: Two times a day (BID) | ORAL | 0 refills | Status: DC

## 2023-02-05 NOTE — Telephone Encounter (Signed)
 Patient needs PA submitted for Ozempic. Please complete.

## 2023-02-05 NOTE — Progress Notes (Signed)
 Patient ID: Jared Bradley, male    DOB: 11/26/1960, 63 y.o.   MRN: [id number]  This visit was conducted in person.  BP 118/76 (BP Location: Left Arm, Patient Position: Sitting, Cuff Size: Large)   Pulse 77   Temp (!) 97.5 F (36.4 C) (Temporal)   Ht 6' 0.5 (1.842 m)   Wt 291 lb 4 oz (132.1 kg)   SpO2 96%   BMI 38.96 kg/m    CC:  Chief Complaint  Patient presents with   Skin Lesion    Right Lower Leg    Subjective:   HPI: Jared Bradley is a 63 y.o. male  patient of Mallie Foerster with history of  obesity, DM2,  and HTN presenting on 02/05/2023 for Skin Lesion (Right Lower Leg)  New onset skin lesion  on left lower leg.. noted 5 days.SABRA started  as red bump, now pustule, redness spreading.  Pain at site.  No discharge.  Has not treated with anything.   No flu like illness, no fever.  No N/V.  Lab Results  Component Value Date   HGBA1C 7.0 (H) 12/28/2022       Hx  of  MRSA... per wound culture 2022  Relevant past medical, surgical, family and social history reviewed and updated as indicated. Interim medical history since our last visit reviewed. Allergies and medications reviewed and updated. Outpatient Medications Prior to Visit  Medication Sig Dispense Refill   amLODipine  (NORVASC ) 10 MG tablet TAKE 1 TABLET BY MOUTH EVERY DAY FOR BLOOD PRESSURE 90 tablet 0   ASPIRIN 81 PO Take 81 mg by mouth daily.     diclofenac  (VOLTAREN ) 75 MG EC tablet TAKE 1 TABLET BY MOUTH TWICE A DAY 50 tablet 2   hydrochlorothiazide  (HYDRODIURIL ) 25 MG tablet Take 1 tablet (25 mg total) by mouth daily. for blood pressure. 90 tablet 3   losartan  (COZAAR ) 100 MG tablet TAKE 1 TABLET BY MOUTH EVERY DAY FOR BLOOD PRESSURE 90 tablet 0   metFORMIN  (GLUCOPHAGE -XR) 500 MG 24 hr tablet TAKE 1 TABLET (500 MG TOTAL) BY MOUTH DAILY WITH BREAKFAST. FOR DIABETES. 90 tablet 0   PARoxetine  (PAXIL ) 20 MG tablet Take 1 tablet (20 mg total) by mouth daily. For anxiety. 90 tablet 3   pravastatin   (PRAVACHOL ) 40 MG tablet Take 1 tablet (40 mg total) by mouth daily. for cholesterol. 90 tablet 0   Semaglutide , 1 MG/DOSE, 4 MG/3ML SOPN Inject 1 mg as directed once a week. for diabetes. 9 mL 0   tadalafil  (CIALIS ) 10 MG tablet Take 1 tablet by mouth 30 to 60 minutes prior to sexual activity as needed. 30 tablet 0   No facility-administered medications prior to visit.     Per HPI unless specifically indicated in ROS section below Review of Systems  Constitutional:  Negative for fatigue and fever.  HENT:  Negative for ear pain.   Eyes:  Negative for pain.  Respiratory:  Negative for cough and shortness of breath.   Cardiovascular:  Negative for chest pain, palpitations and leg swelling.  Gastrointestinal:  Negative for abdominal pain.  Genitourinary:  Negative for dysuria.  Musculoskeletal:  Negative for arthralgias.  Neurological:  Negative for syncope, light-headedness and headaches.  Psychiatric/Behavioral:  Negative for dysphoric mood.    Objective:  BP 118/76 (BP Location: Left Arm, Patient Position: Sitting, Cuff Size: Large)   Pulse 77   Temp (!) 97.5 F (36.4 C) (Temporal)   Ht 6' 0.5 (1.842 m)   Wt 291  lb 4 oz (132.1 kg)   SpO2 96%   BMI 38.96 kg/m   Wt Readings from Last 3 Encounters:  02/05/23 291 lb 4 oz (132.1 kg)  12/28/22 293 lb (132.9 kg)  09/28/22 295 lb (133.8 kg)      Physical Exam Vitals reviewed.  Constitutional:      Appearance: He is well-developed.  HENT:     Head: Normocephalic.     Right Ear: Hearing normal.     Left Ear: Hearing normal.     Nose: Nose normal.  Neck:     Thyroid: No thyroid mass or thyromegaly.     Vascular: No carotid bruit.     Trachea: Trachea normal.  Cardiovascular:     Rate and Rhythm: Normal rate and regular rhythm.     Pulses: Normal pulses.     Heart sounds: Heart sounds not distant. No murmur heard.    No friction rub. No gallop.     Comments: No peripheral edema Pulmonary:     Effort: Pulmonary effort is  normal. No respiratory distress.     Breath sounds: Normal breath sounds.  Skin:    General: Skin is warm and dry.     Findings: No rash.     Comments: See photo  Psychiatric:        Speech: Speech normal.        Behavior: Behavior normal.        Thought Content: Thought content normal.       Results for orders placed or performed in visit on 12/28/22  Comprehensive metabolic panel   Collection Time: 12/28/22  8:00 AM  Result Value Ref Range   Sodium 138 135 - 145 mEq/L   Potassium 4.4 3.5 - 5.1 mEq/L   Chloride 102 96 - 112 mEq/L   CO2 32 19 - 32 mEq/L   Glucose, Bld 153 (H) 70 - 99 mg/dL   BUN 16 6 - 23 mg/dL   Creatinine, Ser 9.05 0.40 - 1.50 mg/dL   Total Bilirubin 0.6 0.2 - 1.2 mg/dL   Alkaline Phosphatase 75 39 - 117 U/L   AST 32 0 - 37 U/L   ALT 34 0 - 53 U/L   Total Protein 6.9 6.0 - 8.3 g/dL   Albumin 4.2 3.5 - 5.2 g/dL   GFR 11.75 >39.99 mL/min   Calcium  10.0 8.4 - 10.5 mg/dL  Hemoglobin J8r   Collection Time: 12/28/22  8:00 AM  Result Value Ref Range   Hgb A1c MFr Bld 7.0 (H) 4.6 - 6.5 %  Lipid panel   Collection Time: 12/28/22  8:00 AM  Result Value Ref Range   Cholesterol 172 0 - 200 mg/dL   Triglycerides 851.9 0.0 - 149.0 mg/dL   HDL 68.59 (L) >60.99 mg/dL   VLDL 70.3 0.0 - 59.9 mg/dL   LDL Cholesterol 888 (H) 0 - 99 mg/dL   Total CHOL/HDL Ratio 5    NonHDL 140.69   PSA   Collection Time: 12/28/22  8:00 AM  Result Value Ref Range   PSA 0.66 0.10 - 4.00 ng/mL  Microalbumin / creatinine urine ratio   Collection Time: 12/28/22  8:00 AM  Result Value Ref Range   Microalb, Ur 0.8 0.0 - 1.9 mg/dL   Creatinine,U 844.0 mg/dL   Microalb Creat Ratio 0.5 0.0 - 30.0 mg/g    Assessment and Plan  Left leg cellulitis Assessment & Plan: Acute, new onset left lower leg lesion with surrounding cellulitis.  History of MRSA. No  underlying fluctuance or indication for incision and drainage.  Positive for MRSA in the past in 2022 so we will treat accordingly.   Doxycycline  100 mg p.o. twice daily x 10 days. Patient is a diabetic although well-controlled.  Will have him follow-up in 2 days to ensure improvement of cellulitis. Encouraged him to use warm compresses 2-3 times a day. Patient to notify us  if he is unable to tolerate antibiotics or if redness spreading beyond current site. Patient given mupirocin  ointment to use in the future at onset of possible lesion. Discussed in detail cleaning of personal items, towels and clothing in hot water bleach to decolonize.  He will change from [facility] to antibacterial soap.    History of MRSA infection  Other orders -     Doxycycline  Hyclate; Take 1 tablet (100 mg total) by mouth 2 (two) times daily.  Dispense: 20 tablet; Refill: 0 -     Mupirocin ; Apply 1 Application topically 2 (two) times daily.  Dispense: 22 g; Refill: 0    Return in about 2 days (around 02/07/2023) for  follow up cellulitits.   Greig Ring, MD

## 2023-02-05 NOTE — Assessment & Plan Note (Signed)
 Acute, new onset left lower leg lesion with surrounding cellulitis.  History of MRSA. No underlying fluctuance or indication for incision and drainage.  Positive for MRSA in the past in 2022 so we will treat accordingly.  Doxycycline  100 mg p.o. twice daily x 10 days. Patient is a diabetic although well-controlled.  Will have him follow-up in 2 days to ensure improvement of cellulitis. Encouraged him to use warm compresses 2-3 times a day. Patient to notify us  if he is unable to tolerate antibiotics or if redness spreading beyond current site. Patient given mupirocin  ointment to use in the future at onset of possible lesion. Discussed in detail cleaning of personal items, towels and clothing in hot water bleach to decolonize.  He will change from [facility] to antibacterial soap.

## 2023-02-07 ENCOUNTER — Other Ambulatory Visit (HOSPITAL_COMMUNITY): Payer: Self-pay

## 2023-02-07 ENCOUNTER — Telehealth: Payer: Self-pay | Admitting: Pharmacist

## 2023-02-07 NOTE — Telephone Encounter (Signed)
Pharmacy Patient Advocate Encounter   Received notification from CoverMyMeds that prior authorization for Ozempic (1 MG/DOSE) 4MG /3ML pen-injectors is required/requested.   Insurance verification completed.   The patient is insured through  [employer]  .   Per test claim: PA required; PA submitted to above mentioned insurance via CoverMyMeds Key/confirmation #/EOC [facility] Status is pending

## 2023-02-07 NOTE — Telephone Encounter (Signed)
Pharmacy Patient Advocate Encounter  Received notification from  [facility]  that Prior Authorization for Ozempic (1 MG/DOSE) 4MG /3ML pen-injectors has been APPROVED from 02/07/2023 to 02/07/2024   PA #/Case ID/Reference #: [id number]

## 2023-02-11 ENCOUNTER — Other Ambulatory Visit: Payer: Self-pay | Admitting: Primary Care

## 2023-02-11 DIAGNOSIS — I10 Essential (primary) hypertension: Secondary | ICD-10-CM

## 2023-02-11 NOTE — Telephone Encounter (Signed)
PA for Ozempic has been approved and documented in separate encounter. Clinic and pt both aware of approval, signing off on duplicate encounter.

## 2023-02-15 ENCOUNTER — Other Ambulatory Visit: Payer: Self-pay | Admitting: Primary Care

## 2023-02-15 DIAGNOSIS — E119 Type 2 diabetes mellitus without complications: Secondary | ICD-10-CM

## 2023-02-15 DIAGNOSIS — I10 Essential (primary) hypertension: Secondary | ICD-10-CM

## 2023-02-15 DIAGNOSIS — E785 Hyperlipidemia, unspecified: Secondary | ICD-10-CM

## 2023-02-15 DIAGNOSIS — F411 Generalized anxiety disorder: Secondary | ICD-10-CM

## 2023-03-12 ENCOUNTER — Other Ambulatory Visit: Payer: Self-pay | Admitting: Primary Care

## 2023-03-12 DIAGNOSIS — E1165 Type 2 diabetes mellitus with hyperglycemia: Secondary | ICD-10-CM

## 2023-04-12 LAB — HM DIABETES EYE EXAM

## 2023-06-12 ENCOUNTER — Other Ambulatory Visit: Payer: Self-pay | Admitting: Primary Care

## 2023-06-12 DIAGNOSIS — E1165 Type 2 diabetes mellitus with hyperglycemia: Secondary | ICD-10-CM

## 2023-06-28 ENCOUNTER — Ambulatory Visit: Payer: BC Managed Care – PPO | Admitting: Primary Care

## 2023-07-12 ENCOUNTER — Other Ambulatory Visit: Payer: Self-pay | Admitting: Primary Care

## 2023-07-12 DIAGNOSIS — E1165 Type 2 diabetes mellitus with hyperglycemia: Secondary | ICD-10-CM

## 2023-07-12 NOTE — Telephone Encounter (Signed)
 lvm for pt to call office to schedule appt.

## 2023-07-12 NOTE — Telephone Encounter (Signed)
 Patient is due for diabetes follow up and missed his last appointment, this will be required prior to any further refills.  Please schedule, thank you!

## 2023-07-18 ENCOUNTER — Ambulatory Visit: Admitting: Primary Care

## 2023-07-18 ENCOUNTER — Ambulatory Visit: Payer: Self-pay | Admitting: Primary Care

## 2023-07-18 ENCOUNTER — Encounter: Payer: Self-pay | Admitting: Primary Care

## 2023-07-18 VITALS — BP 146/82 | HR 88 | Temp 97.2°F | Ht 72.5 in | Wt 280.0 lb

## 2023-07-18 DIAGNOSIS — I10 Essential (primary) hypertension: Secondary | ICD-10-CM | POA: Diagnosis not present

## 2023-07-18 DIAGNOSIS — Z7984 Long term (current) use of oral hypoglycemic drugs: Secondary | ICD-10-CM

## 2023-07-18 DIAGNOSIS — E1165 Type 2 diabetes mellitus with hyperglycemia: Secondary | ICD-10-CM

## 2023-07-18 DIAGNOSIS — E785 Hyperlipidemia, unspecified: Secondary | ICD-10-CM | POA: Diagnosis not present

## 2023-07-18 LAB — POCT GLYCOSYLATED HEMOGLOBIN (HGB A1C): Hemoglobin A1C: 6.1 % — AB (ref 4.0–5.6)

## 2023-07-18 MED ORDER — ATORVASTATIN CALCIUM 10 MG PO TABS
10.0000 mg | ORAL_TABLET | Freq: Every day | ORAL | 0 refills | Status: DC
Start: 2023-07-18 — End: 2023-08-15

## 2023-07-18 NOTE — Patient Instructions (Signed)
 Remain off metformin  for diabetes.  Stop pravastatin  for cholesterol.  Start atorvastatin 10 mg daily for cholesterol.  Resume your losartan  blood pressure pill.  Please schedule a physical to meet with me in 6 months.   It was a pleasure to see you today!

## 2023-07-18 NOTE — Assessment & Plan Note (Signed)
 Above goal today.  Disposition resume losartan  100 mg daily. Continue amlodipine  10 mg daily and hydrochlorothiazide  25 once daily.

## 2023-07-18 NOTE — Assessment & Plan Note (Signed)
 Myalgias with pravastatin .   Stop pravastatin , start atorvastatin 10 mg daily. He will update.

## 2023-07-18 NOTE — Assessment & Plan Note (Signed)
 Improved and at goal with A1c of 6.1 today  Remain off metformin  ER 500 mg daily per patient preference. Continue Ozempic  1 mg weekly.  Stop pravastatin , switch to atorvastatin due to myalgias. Work on cutting back on Medtronic but  Follow-up in 6 months.

## 2023-07-18 NOTE — Progress Notes (Signed)
 Subjective:    Patient ID: Jared Bradley, male    DOB: March 06, 1960, 63 y.o.   MRN: [id number]  HPI  Jared Bradley is a very pleasant 63 y.o. male with a history of hypertension, sleep apnea, type 2 diabetes, hyperlipidemia who presents today for follow-up of diabetes.  Current medications include: Metformin  ER 500 mg daily, Ozempic  1 mg weekly. He stopped taking metformin  and losartan  1-2 months ago as he didn't want to take many pills.  He stopped taking pravastatin  due to symptoms of weakness and joint aches.   He is checking his blood glucose 0 times daily.  Last A1C: 7.0 in December 2024, 6.1 today Last Eye Exam: Up-to-date Last Foot Exam: Up-to-date Pneumonia Vaccination: 2019 Urine Microalbumin: Up-to-date Statin: Pravastatin   Dietary changes since last visit: Smaller portion sizes. He continues to eat take out/fast food often.    Exercise: Active and walking  BP Readings from Last 3 Encounters:  07/18/23 (!) 146/82  02/05/23 118/76  12/28/22 114/78   Wt Readings from Last 3 Encounters:  07/18/23 280 lb (127 kg)  02/05/23 291 lb 4 oz (132.1 kg)  12/28/22 293 lb (132.9 kg)      Review of Systems  Eyes:  Negative for visual disturbance.  Respiratory:  Negative for shortness of breath.   Cardiovascular:  Negative for chest pain.  Neurological:  Negative for numbness.         Past Medical History:  Diagnosis Date   Anxiety    Cellulitis, leg 05/13/2019   Cerumen impaction 08/16/2020   Hyperlipidemia    Hypertension    Poison ivy dermatitis 07/23/2019    Social History   Socioeconomic History   Marital status: Married    Spouse name: Not on file   Number of children: Not on file   Years of education: Not on file   Highest education level: GED or equivalent  Occupational History   Not on file  Tobacco Use   Smoking status: Former   Smokeless tobacco: Never  Substance and Sexual Activity   Alcohol use: No   Drug use: No   Sexual  activity: Not on file  Other Topics Concern   Not on file  Social History Narrative   Married.   3 children.   Works as a Naval architect   Enjoys hunting   Social Drivers of Corporate investment banker Strain: Low Risk  (12/24/2022)   Overall Financial Resource Strain (CARDIA)    Difficulty of Paying Living Expenses: Not hard at all  Food Insecurity: No Food Insecurity (12/24/2022)   Hunger Vital Sign    Worried About Running Out of Food in the Last Year: Never true    Ran Out of Food in the Last Year: Never true  Transportation Needs: No Transportation Needs (12/24/2022)   PRAPARE - Administrator, Civil Service (Medical): No    Lack of Transportation (Non-Medical): No  Physical Activity: Insufficiently Active (12/24/2022)   Exercise Vital Sign    Days of Exercise per Week: 1 day    Minutes of Exercise per Session: 10 min  Stress: No Stress Concern Present (12/24/2022)   [employer] of Occupational Health - Occupational Stress Questionnaire    Feeling of Stress : Only a little  Social Connections: Moderately Integrated (12/24/2022)   Social Connection and Isolation Panel    Frequency of Communication with Friends and Family: More than three times a week    Frequency of Social Gatherings with Friends  and Family: Once a week    Attends Religious Services: Never    Active Member of Clubs or Organizations: Yes    Attends Banker Meetings: 1 to 4 times per year    Marital Status: Married  Catering manager Violence: Low Risk  (05/08/2019)   Received from [facility]   Intimate Partner Violence    Insults You: Not on file    Threatens You: Not on file    Screams at You: Not on file    Physically Hurt: Not on file    Intimate Partner Violence Score: Not on file    History reviewed. No pertinent surgical history.  Family History  Problem Relation Age of Onset   Heart disease Father    Hypertension Father    Hyperlipidemia Father    Heart disease  Sister    Diabetes Brother    Hypertension Maternal Grandmother     Allergies  Allergen Reactions   Hydrochlorothiazide -Triamterene Nausea Only   Rosuvastatin  Other (See Comments)    Started after 5 years of treatment   Lipitor [Atorvastatin] Other (See Comments)    Myalgias    Current Outpatient Medications on File Prior to Visit  Medication Sig Dispense Refill   amLODipine  (NORVASC ) 10 MG tablet TAKE 1 TABLET BY MOUTH EVERY DAY FOR BLOOD PRESSURE 90 tablet 2   ASPIRIN 81 PO Take 81 mg by mouth daily.     hydrochlorothiazide  (HYDRODIURIL ) 25 MG tablet TAKE 1 TABLET BY MOUTH EVERY DAY FOR BLOOD PRESSURE 90 tablet 2   losartan  (COZAAR ) 100 MG tablet TAKE 1 TABLET BY MOUTH EVERY DAY FOR BLOOD PRESSURE 90 tablet 2   PARoxetine  (PAXIL ) 20 MG tablet TAKE 1 TABLET (20 MG TOTAL) BY MOUTH DAILY. FOR ANXIETY. 90 tablet 2   Semaglutide , 1 MG/DOSE, (OZEMPIC , 1 MG/DOSE,) 4 MG/3ML SOPN INJECT 1 MG INTO THE SKIN ONCE A WEEK. FOR DIABETES. 3 mL 0   tadalafil  (CIALIS ) 10 MG tablet Take 1 tablet by mouth 30 to 60 minutes prior to sexual activity as needed. 30 tablet 0   No current facility-administered medications on file prior to visit.    BP (!) 146/82   Pulse 88   Temp (!) 97.2 F (36.2 C) (Temporal)   Ht 6' 0.5 (1.842 m)   Wt 280 lb (127 kg)   SpO2 97%   BMI 37.45 kg/m  Objective:   Physical Exam  Cardiovascular:     Rate and Rhythm: Normal rate and regular rhythm.  Pulmonary:     Effort: Pulmonary effort is normal.     Breath sounds: Normal breath sounds.   Musculoskeletal:     Cervical back: Neck supple.   Skin:    General: Skin is warm and dry.   Neurological:     Mental Status: He is alert and oriented to person, place, and time.   Psychiatric:        Mood and Affect: Mood normal.           Assessment & Plan:  Type 2 diabetes mellitus with hyperglycemia, without long-term current use of insulin (HCC) Assessment & Plan: Improved and at goal with A1c of 6.1  today  Remain off metformin  ER 500 mg daily per patient preference. Continue Ozempic  1 mg weekly.  Stop pravastatin , switch to atorvastatin due to myalgias. Work on cutting back on Medtronic but  Follow-up in 6 months.  Orders: -     POCT glycosylated hemoglobin (Hb A1C)  Hyperlipidemia, unspecified hyperlipidemia type Assessment &  Plan: Myalgias with pravastatin .   Stop pravastatin , start atorvastatin 10 mg daily. He will update.  Orders: -     Atorvastatin Calcium ; Take 1 tablet (10 mg total) by mouth daily. for cholesterol.  Dispense: 30 tablet; Refill: 0  Essential hypertension Assessment & Plan: Above goal today.  Disposition resume losartan  100 mg daily. Continue amlodipine  10 mg daily and hydrochlorothiazide  25 once daily.         Estella Malatesta K Edlyn Rosenburg, NP

## 2023-07-24 ENCOUNTER — Other Ambulatory Visit: Payer: Self-pay | Admitting: Primary Care

## 2023-07-24 DIAGNOSIS — E1165 Type 2 diabetes mellitus with hyperglycemia: Secondary | ICD-10-CM

## 2023-08-15 ENCOUNTER — Other Ambulatory Visit: Payer: Self-pay | Admitting: Primary Care

## 2023-08-15 DIAGNOSIS — E785 Hyperlipidemia, unspecified: Secondary | ICD-10-CM

## 2023-10-29 ENCOUNTER — Other Ambulatory Visit: Payer: Self-pay | Admitting: Primary Care

## 2023-10-29 DIAGNOSIS — E1165 Type 2 diabetes mellitus with hyperglycemia: Secondary | ICD-10-CM

## 2023-10-29 NOTE — Telephone Encounter (Signed)
 Please call patient:  Received notification from the pharmacy that Ozempic  may not be covered by his insurance?  This is the case?  Does he have enough Ozempic  to make it to his appointment December?

## 2023-10-30 NOTE — Telephone Encounter (Signed)
 Unable to reach patient. Left voicemail to return call to our office.

## 2023-10-31 ENCOUNTER — Other Ambulatory Visit (HOSPITAL_COMMUNITY): Payer: Self-pay

## 2023-10-31 ENCOUNTER — Telehealth: Payer: Self-pay

## 2023-10-31 NOTE — Telephone Encounter (Signed)
 Pharmacy Patient Advocate Encounter   Received notification from Onbase that prior authorization for Ozempic  4 is required/requested.   Insurance verification completed.   The patient is insured through [employer].   Per test claim: PA required; PA submitted to above mentioned insurance via Latent Key/confirmation #/EOC [id number] Status is pending

## 2023-10-31 NOTE — Telephone Encounter (Signed)
 Noted

## 2023-10-31 NOTE — Telephone Encounter (Signed)
 Called and spoke with patient, states he is unsure if insurance will cover ozempic  as he just changed insurances. He has enough to last him until December appt.

## 2023-11-05 ENCOUNTER — Other Ambulatory Visit (HOSPITAL_COMMUNITY): Payer: Self-pay

## 2023-11-05 NOTE — Telephone Encounter (Signed)
 Pharmacy Patient Advocate Encounter  Received notification from [employer] that Prior Authorization for Ozempic  4 has been APPROVED from 10/31/23 to 11/04/24. Ran test claim, Copay is $0.00. This test claim was processed through [facility]- copay amounts may vary at other pharmacies due to pharmacy/plan contracts, or as the patient moves through the different stages of their insurance plan.   PA #/Case ID/Reference #: # [id number]

## 2023-12-12 ENCOUNTER — Other Ambulatory Visit: Payer: Self-pay | Admitting: Primary Care

## 2023-12-12 DIAGNOSIS — I10 Essential (primary) hypertension: Secondary | ICD-10-CM

## 2024-01-13 ENCOUNTER — Encounter: Payer: Self-pay | Admitting: Primary Care

## 2024-01-17 ENCOUNTER — Encounter: Payer: Self-pay | Admitting: Primary Care

## 2024-01-22 ENCOUNTER — Ambulatory Visit: Payer: Self-pay | Admitting: Primary Care

## 2024-01-22 ENCOUNTER — Other Ambulatory Visit (INDEPENDENT_AMBULATORY_CARE_PROVIDER_SITE_OTHER)

## 2024-01-22 ENCOUNTER — Other Ambulatory Visit: Payer: Self-pay | Admitting: Primary Care

## 2024-01-22 DIAGNOSIS — I10 Essential (primary) hypertension: Secondary | ICD-10-CM

## 2024-01-22 DIAGNOSIS — E1165 Type 2 diabetes mellitus with hyperglycemia: Secondary | ICD-10-CM

## 2024-01-22 DIAGNOSIS — E785 Hyperlipidemia, unspecified: Secondary | ICD-10-CM

## 2024-01-22 DIAGNOSIS — Z125 Encounter for screening for malignant neoplasm of prostate: Secondary | ICD-10-CM

## 2024-01-22 LAB — LIPID PANEL
Cholesterol: 232 mg/dL — ABNORMAL HIGH (ref 28–200)
HDL: 37.3 mg/dL — ABNORMAL LOW
LDL Cholesterol: 163 mg/dL — ABNORMAL HIGH (ref 10–99)
NonHDL: 194.77
Total CHOL/HDL Ratio: 6
Triglycerides: 161 mg/dL — ABNORMAL HIGH (ref 10.0–149.0)
VLDL: 32.2 mg/dL (ref 0.0–40.0)

## 2024-01-22 LAB — COMPREHENSIVE METABOLIC PANEL WITH GFR
ALT: 28 U/L (ref 3–53)
AST: 24 U/L (ref 5–37)
Albumin: 4.6 g/dL (ref 3.5–5.2)
Alkaline Phosphatase: 71 U/L (ref 39–117)
BUN: 13 mg/dL (ref 6–23)
CO2: 30 meq/L (ref 19–32)
Calcium: 9.9 mg/dL (ref 8.4–10.5)
Chloride: 99 meq/L (ref 96–112)
Creatinine, Ser: 0.85 mg/dL (ref 0.40–1.50)
GFR: 93.88 mL/min
Glucose, Bld: 105 mg/dL — ABNORMAL HIGH (ref 70–99)
Potassium: 4.5 meq/L (ref 3.5–5.1)
Sodium: 135 meq/L (ref 135–145)
Total Bilirubin: 0.7 mg/dL (ref 0.2–1.2)
Total Protein: 7.7 g/dL (ref 6.0–8.3)

## 2024-01-22 LAB — MICROALBUMIN / CREATININE URINE RATIO
Creatinine,U: 62.1 mg/dL
Microalb Creat Ratio: UNDETERMINED mg/g (ref 0.0–30.0)
Microalb, Ur: <0.7 mg/dL

## 2024-01-22 LAB — PSA: PSA: 0.8 ng/mL (ref 0.10–4.00)

## 2024-01-22 LAB — HEMOGLOBIN A1C: Hgb A1c MFr Bld: 6.4 % (ref 4.6–6.5)

## 2024-01-28 ENCOUNTER — Telehealth: Payer: Self-pay

## 2024-01-28 ENCOUNTER — Other Ambulatory Visit (HOSPITAL_COMMUNITY): Payer: Self-pay

## 2024-01-28 NOTE — Telephone Encounter (Signed)
 Pharmacy Patient Advocate Encounter   Received notification from [facility] KEY that prior authorization for Ozempic  4 is required/requested.   Insurance verification completed.   The patient is insured through [employer].   Per test claim: The current 28 day co-pay is, $0.00.  No PA needed at this time. This test claim was processed through [facility]- copay amounts may vary at other pharmacies due to pharmacy/plan contracts, or as the patient moves through the different stages of their insurance plan.

## 2024-01-29 NOTE — Telephone Encounter (Signed)
 Noted

## 2024-02-05 ENCOUNTER — Ambulatory Visit: Admitting: Primary Care

## 2024-02-05 ENCOUNTER — Encounter: Payer: Self-pay | Admitting: Primary Care

## 2024-02-05 VITALS — BP 128/76 | HR 77 | Temp 98.0°F | Ht 72.5 in | Wt 285.0 lb

## 2024-02-05 DIAGNOSIS — F411 Generalized anxiety disorder: Secondary | ICD-10-CM

## 2024-02-05 DIAGNOSIS — I10 Essential (primary) hypertension: Secondary | ICD-10-CM | POA: Diagnosis not present

## 2024-02-05 DIAGNOSIS — T466X5A Adverse effect of antihyperlipidemic and antiarteriosclerotic drugs, initial encounter: Secondary | ICD-10-CM

## 2024-02-05 DIAGNOSIS — Z0000 Encounter for general adult medical examination without abnormal findings: Secondary | ICD-10-CM | POA: Diagnosis not present

## 2024-02-05 DIAGNOSIS — N529 Male erectile dysfunction, unspecified: Secondary | ICD-10-CM

## 2024-02-05 DIAGNOSIS — Z23 Encounter for immunization: Secondary | ICD-10-CM

## 2024-02-05 DIAGNOSIS — Z7985 Long-term (current) use of injectable non-insulin antidiabetic drugs: Secondary | ICD-10-CM

## 2024-02-05 DIAGNOSIS — E785 Hyperlipidemia, unspecified: Secondary | ICD-10-CM | POA: Diagnosis not present

## 2024-02-05 DIAGNOSIS — E1165 Type 2 diabetes mellitus with hyperglycemia: Secondary | ICD-10-CM

## 2024-02-05 DIAGNOSIS — M791 Myalgia, unspecified site: Secondary | ICD-10-CM | POA: Diagnosis not present

## 2024-02-05 MED ORDER — EZETIMIBE 10 MG PO TABS: 10.0000 mg | ORAL_TABLET | Freq: Every day | ORAL | 3 refills | Status: AC

## 2024-02-05 NOTE — Patient Instructions (Signed)
 Start Zetia  10 mg tablets once daily for cholesterol.  Schedule a lab only appointment for 2 months to recheck cholesterol.  Please schedule a follow up visit for 6 months for a diabetes check.  It was a pleasure to see you today!

## 2024-02-05 NOTE — Progress Notes (Signed)
 "  Subjective:    Patient ID: Jared Bradley, male    DOB: 11/07/1960, 64 y.o.   MRN: [id number]  Guerin Lashomb Eberwein is a very pleasant 64 y.o. male who presents today for complete physical and follow up of chronic conditions.  Immunizations: -Tetanus: Completed in 2015 -Influenza: Influenza vaccine provided today.  -Shingles: Completed Shingrix series -Pneumonia: Completed 2019  Diet: Fair diet.  Exercise: No regular exercise.  Eye exam: Completes annually  Dental exam: Completes semi-annually    Colonoscopy: Never completed, declined last year.  Declines Cologuard.    PSA: UTD   BP Readings from Last 3 Encounters:  02/05/24 128/76  07/18/23 (!) 146/82  02/05/23 118/76        Review of Systems  Constitutional:  Negative for unexpected weight change.  HENT:  Negative for rhinorrhea.   Respiratory:  Negative for cough and shortness of breath.   Cardiovascular:  Negative for chest pain.  Gastrointestinal:  Negative for constipation and diarrhea.  Genitourinary:  Negative for difficulty urinating.  Musculoskeletal:  Negative for arthralgias and myalgias.  Skin:  Negative for rash.  Allergic/Immunologic: Negative for environmental allergies.  Neurological:  Negative for dizziness and headaches.  Psychiatric/Behavioral:  The patient is not nervous/anxious.          Past Medical History:  Diagnosis Date   Anxiety    Cellulitis, leg 05/13/2019   Cerumen impaction 08/16/2020   Hyperlipidemia    Hypertension    Left leg cellulitis 02/05/2023   Poison ivy dermatitis 07/23/2019    Social History   Socioeconomic History   Marital status: Married    Spouse name: Not on file   Number of children: Not on file   Years of education: Not on file   Highest education level: GED or equivalent  Occupational History   Not on file  Tobacco Use   Smoking status: Former   Smokeless tobacco: Never  Substance and Sexual Activity   Alcohol use: No   Drug use:  No   Sexual activity: Not on file  Other Topics Concern   Not on file  Social History Narrative   Married.   3 children.   Works as a Naval Architect   Enjoys hunting   Social Drivers of Health   Tobacco Use: Medium Risk (02/05/2024)   Patient History    Smoking Tobacco Use: Former    Smokeless Tobacco Use: Never    Passive Exposure: Not on Actuary Strain: Low Risk (12/24/2022)   Overall Financial Resource Strain (CARDIA)    Difficulty of Paying Living Expenses: Not hard at all  Food Insecurity: No Food Insecurity (12/24/2022)   Hunger Vital Sign    Worried About Running Out of Food in the Last Year: Never true    Ran Out of Food in the Last Year: Never true  Transportation Needs: No Transportation Needs (12/24/2022)   PRAPARE - Administrator, Civil Service (Medical): No    Lack of Transportation (Non-Medical): No  Physical Activity: Insufficiently Active (12/24/2022)   Exercise Vital Sign    Days of Exercise per Week: 1 day    Minutes of Exercise per Session: 10 min  Stress: No Stress Concern Present (12/24/2022)   [employer] of Occupational Health - Occupational Stress Questionnaire    Feeling of Stress : Only a little  Social Connections: Moderately Integrated (12/24/2022)   Social Connection and Isolation Panel    Frequency of Communication with Friends and Family: More than  three times a week    Frequency of Social Gatherings with Friends and Family: Once a week    Attends Religious Services: Never    Database Administrator or Organizations: Yes    Attends Banker Meetings: 1 to 4 times per year    Marital Status: Married  Catering Manager Violence: Not on file  Depression (PHQ2-9): Low Risk (07/18/2023)   Depression (PHQ2-9)    PHQ-2 Score: 0  Alcohol Screen: Low Risk (12/24/2022)   Alcohol Screen    Last Alcohol Screening Score (AUDIT): 0  Housing: Low Risk (12/24/2022)   Housing    Last Housing Risk Score: 0  Utilities:  Not on file  Health Literacy: Not on file    No past surgical history on file.  Family History  Problem Relation Age of Onset   Heart disease Father    Hypertension Father    Hyperlipidemia Father    Heart disease Sister    Diabetes Brother    Hypertension Maternal Grandmother     Allergies[1]  Medications Ordered Prior to Encounter[2]  BP 128/76 (BP Location: Left Arm, Patient Position: Sitting, Cuff Size: Large)   Pulse 77   Temp 98 F (36.7 C) (Temporal)   Ht 6' 0.5 (1.842 m)   Wt 285 lb (129.3 kg)   SpO2 98%   BMI 38.12 kg/m  Objective:   Physical Exam HENT:     Right Ear: Tympanic membrane and ear canal normal.     Left Ear: Tympanic membrane and ear canal normal.  Eyes:     Pupils: Pupils are equal, round, and reactive to light.  Cardiovascular:     Rate and Rhythm: Normal rate and regular rhythm.  Pulmonary:     Effort: Pulmonary effort is normal.     Breath sounds: Normal breath sounds.  Abdominal:     General: Bowel sounds are normal.     Palpations: Abdomen is soft.     Tenderness: There is no abdominal tenderness.  Musculoskeletal:        General: Normal range of motion.     Cervical back: Neck supple.  Skin:    General: Skin is warm and dry.  Neurological:     Mental Status: He is alert and oriented to person, place, and time.     Cranial Nerves: No cranial nerve deficit.     Deep Tendon Reflexes:     Reflex Scores:      Patellar reflexes are 2+ on the right side and 2+ on the left side. Psychiatric:        Mood and Affect: Mood normal.     Physical Exam        Assessment & Plan:  Need for influenza vaccination -     Flu vaccine trivalent PF, 6mos and older(Flulaval,Afluria,Fluarix,Fluzone)  Hyperlipidemia, unspecified hyperlipidemia type Assessment & Plan: Uncontrolled.  Intolerant to 3 different statin medications. Start Zetia  10 mg daily.  Orders: -     Ezetimibe ; Take 1 tablet (10 mg total) by mouth daily. for  cholesterol.  Dispense: 90 tablet; Refill: 3  Essential hypertension Assessment & Plan: Controlled.  Continue amlodipine  10 mg daily, hydrochlorothiazide  25 mg daily, losartan  100 mg daily. CMP reviewed    Type 2 diabetes mellitus with hyperglycemia, without long-term current use of insulin (HCC) Assessment & Plan: Slightly deteriorated but overall controlled.  Continue Ozempic  1 mg weekly.  Follow-up in 6 months   Preventative health care Assessment & Plan: Influenza vaccine provided today. Declines  tetanus vaccine. Colonoscopy overdue, he continues to decline colonoscopy and Cologuard despite recommendations PSA stable.  Discussed the importance of a healthy diet and regular exercise in order for weight loss, and to reduce the risk of further co-morbidity.  Exam stable. Labs reviewed.  Follow up in 1 year for repeat physical.    Myalgia due to statin Assessment & Plan: Intolerant to 3 statins.  Start Zetia  10 mg daily.    GAD (generalized anxiety disorder) Assessment & Plan: No concerns today.  Continue paroxetine  20 mg daily   Erectile dysfunction, unspecified erectile dysfunction type Assessment & Plan: No concerns today.  Continue tadalafil  10 mg PRN     Assessment and Plan Assessment & Plan         Comer MARLA Gaskins, NP       [1]  Allergies Allergen Reactions   Hydrochlorothiazide -Triamterene Nausea Only   Rosuvastatin  Other (See Comments)    Started after 5 years of treatment   Lipitor [Atorvastatin ] Other (See Comments)    Myalgias  [2]  Current Outpatient Medications on File Prior to Visit  Medication Sig Dispense Refill   amLODipine  (NORVASC ) 10 MG tablet TAKE 1 TABLET BY MOUTH EVERY DAY FOR BLOOD PRESSURE 90 tablet 2   ASPIRIN 81 PO Take 81 mg by mouth daily.     hydrochlorothiazide  (HYDRODIURIL ) 25 MG tablet TAKE 1 TABLET BY MOUTH EVERY DAY FOR BLOOD PRESSURE 90 tablet 0   losartan  (COZAAR ) 100 MG tablet TAKE 1  TABLET BY MOUTH EVERY DAY FOR BLOOD PRESSURE 90 tablet 2   PARoxetine  (PAXIL ) 20 MG tablet TAKE 1 TABLET (20 MG TOTAL) BY MOUTH DAILY. FOR ANXIETY. 90 tablet 2   Semaglutide , 1 MG/DOSE, (OZEMPIC , 1 MG/DOSE,) 4 MG/3ML SOPN INJECT 1 MG INTO THE SKIN ONCE A WEEK. FOR DIABETES. 9 mL 1   tadalafil  (CIALIS ) 10 MG tablet Take 1 tablet by mouth 30 to 60 minutes prior to sexual activity as needed. 30 tablet 0   No current facility-administered medications on file prior to visit.   "

## 2024-02-05 NOTE — Assessment & Plan Note (Signed)
 No concerns today.  Continue tadalafil  10 mg PRN

## 2024-02-05 NOTE — Assessment & Plan Note (Signed)
 Uncontrolled.  Intolerant to 3 different statin medications. Start Zetia  10 mg daily.

## 2024-02-05 NOTE — Assessment & Plan Note (Signed)
 Controlled.  Continue amlodipine  10 mg daily, hydrochlorothiazide  25 mg daily, losartan  100 mg daily. CMP reviewed

## 2024-02-05 NOTE — Assessment & Plan Note (Signed)
 No concerns today.  Continue paroxetine  20 mg daily

## 2024-02-05 NOTE — Assessment & Plan Note (Addendum)
 Slightly deteriorated but overall controlled.  Continue Ozempic  1 mg weekly.  Follow-up in 6 months

## 2024-02-05 NOTE — Assessment & Plan Note (Signed)
 Influenza vaccine provided today. Declines tetanus vaccine. Colonoscopy overdue, he continues to decline colonoscopy and Cologuard despite recommendations PSA stable.  Discussed the importance of a healthy diet and regular exercise in order for weight loss, and to reduce the risk of further co-morbidity.  Exam stable. Labs reviewed.  Follow up in 1 year for repeat physical.

## 2024-02-05 NOTE — Assessment & Plan Note (Signed)
 Intolerant to 3 statins.  Start Zetia  10 mg daily.

## 2024-02-06 ENCOUNTER — Other Ambulatory Visit: Payer: Self-pay | Admitting: Primary Care

## 2024-02-06 DIAGNOSIS — E1165 Type 2 diabetes mellitus with hyperglycemia: Secondary | ICD-10-CM

## 2024-04-07 ENCOUNTER — Other Ambulatory Visit
